# Patient Record
Sex: Female | Born: 1970 | Race: White | Hispanic: No | Marital: Married | Smoking: Former smoker
Health system: Southern US, Community
[De-identification: ages and names within clinical notes are randomized; demographics above are authoritative.]

## PROBLEM LIST (undated history)

## (undated) DIAGNOSIS — R110 Nausea: Secondary | ICD-10-CM

## (undated) DIAGNOSIS — Z8659 Personal history of other mental and behavioral disorders: Secondary | ICD-10-CM

## (undated) DIAGNOSIS — G2581 Restless legs syndrome: Secondary | ICD-10-CM

## (undated) DIAGNOSIS — I10 Essential (primary) hypertension: Secondary | ICD-10-CM

## (undated) DIAGNOSIS — N816 Rectocele: Secondary | ICD-10-CM

## (undated) DIAGNOSIS — F429 Obsessive-compulsive disorder, unspecified: Secondary | ICD-10-CM

## (undated) DIAGNOSIS — N8110 Cystocele, unspecified: Secondary | ICD-10-CM

## (undated) HISTORY — PX: ABDOMINAL HYSTERECTOMY: SHX81

## (undated) HISTORY — DX: Personal history of other mental and behavioral disorders: Z86.59

## (undated) HISTORY — PX: TUBAL LIGATION: SHX77

---

## 2000-03-25 ENCOUNTER — Ambulatory Visit (HOSPITAL_COMMUNITY): Admission: RE | Admit: 2000-03-25 | Discharge: 2000-03-25 | Payer: Self-pay | Admitting: Gastroenterology

## 2000-03-25 ENCOUNTER — Encounter: Payer: Self-pay | Admitting: Gastroenterology

## 2006-12-19 ENCOUNTER — Emergency Department: Payer: Self-pay | Admitting: Emergency Medicine

## 2008-12-10 ENCOUNTER — Emergency Department: Payer: Self-pay | Admitting: Emergency Medicine

## 2008-12-12 ENCOUNTER — Emergency Department: Payer: Self-pay | Admitting: Internal Medicine

## 2012-04-17 ENCOUNTER — Ambulatory Visit: Payer: Self-pay | Admitting: General Practice

## 2012-04-17 LAB — CREATININE, SERUM
Creatinine: 0.72 mg/dL (ref 0.60–1.30)
EGFR (African American): >60
EGFR (Non-African Amer.): >60

## 2012-04-17 LAB — HCG, QUANTITATIVE, PREGNANCY: Beta Hcg, Quant.: <1 m[IU]/mL — ABNORMAL LOW

## 2012-04-22 LAB — HM PAP SMEAR: HM Pap smear: NEGATIVE

## 2012-05-09 ENCOUNTER — Ambulatory Visit: Payer: Self-pay | Admitting: Obstetrics and Gynecology

## 2012-05-09 LAB — HEMOGLOBIN: HGB: 13.8 g/dL (ref 12.0–16.0)

## 2012-05-09 LAB — BASIC METABOLIC PANEL
Anion Gap: 9 (ref 7–16)
BUN: 10 mg/dL (ref 7–18)
Calcium, Total: 8.9 mg/dL (ref 8.5–10.1)
Chloride: 108 mmol/L — ABNORMAL HIGH (ref 98–107)
Co2: 23 mmol/L (ref 21–32)
Creatinine: 0.68 mg/dL (ref 0.60–1.30)
EGFR (African American): >60
EGFR (Non-African Amer.): >60
Glucose: 90 mg/dL (ref 65–99)
Osmolality: 278 (ref 275–301)
Potassium: 3.5 mmol/L (ref 3.5–5.1)
Sodium: 140 mmol/L (ref 136–145)

## 2012-05-09 LAB — PREGNANCY, URINE: Pregnancy Test, Urine: NEGATIVE m[IU]/mL

## 2012-05-15 ENCOUNTER — Ambulatory Visit: Payer: Self-pay | Admitting: Obstetrics and Gynecology

## 2012-05-16 LAB — BASIC METABOLIC PANEL
Anion Gap: 7 (ref 7–16)
BUN: 8 mg/dL (ref 7–18)
Calcium, Total: 8.5 mg/dL (ref 8.5–10.1)
Chloride: 108 mmol/L — ABNORMAL HIGH (ref 98–107)
Co2: 24 mmol/L (ref 21–32)
Creatinine: 0.76 mg/dL (ref 0.60–1.30)
EGFR (African American): >60
EGFR (Non-African Amer.): >60
Glucose: 154 mg/dL — ABNORMAL HIGH (ref 65–99)
Osmolality: 279 (ref 275–301)
Potassium: 4.1 mmol/L (ref 3.5–5.1)
Sodium: 139 mmol/L (ref 136–145)

## 2012-05-16 LAB — PATHOLOGY REPORT

## 2012-05-16 LAB — HEMATOCRIT: HCT: 40.1 % (ref 35.0–47.0)

## 2012-05-29 ENCOUNTER — Ambulatory Visit: Payer: Self-pay | Admitting: General Practice

## 2012-06-07 ENCOUNTER — Ambulatory Visit: Payer: Self-pay | Admitting: General Practice

## 2013-07-29 ENCOUNTER — Emergency Department: Payer: Self-pay | Admitting: Emergency Medicine

## 2013-07-29 LAB — COMPREHENSIVE METABOLIC PANEL
AST: 15 U/L (ref 15–37)
Albumin: 3.7 g/dL (ref 3.4–5.0)
Alkaline Phosphatase: 79 U/L
Anion Gap: 5 — ABNORMAL LOW (ref 7–16)
BILIRUBIN TOTAL: 0.4 mg/dL (ref 0.2–1.0)
BUN: 14 mg/dL (ref 7–18)
CO2: 24 mmol/L (ref 21–32)
Calcium, Total: 8.7 mg/dL (ref 8.5–10.1)
Chloride: 109 mmol/L — ABNORMAL HIGH (ref 98–107)
Creatinine: 0.78 mg/dL (ref 0.60–1.30)
EGFR (Non-African Amer.): >60
GLUCOSE: 92 mg/dL (ref 65–99)
OSMOLALITY: 276 (ref 275–301)
Potassium: 4 mmol/L (ref 3.5–5.1)
SGPT (ALT): 26 U/L (ref 12–78)
SODIUM: 138 mmol/L (ref 136–145)
Total Protein: 6.9 g/dL (ref 6.4–8.2)

## 2013-07-29 LAB — CBC
HCT: 42.3 % (ref 35.0–47.0)
HGB: 14.6 g/dL (ref 12.0–16.0)
MCH: 32.7 pg (ref 26.0–34.0)
MCHC: 34.6 g/dL (ref 32.0–36.0)
MCV: 95 fL (ref 80–100)
Platelet: 330 10*3/uL (ref 150–440)
RBC: 4.47 10*6/uL (ref 3.80–5.20)
RDW: 12.8 % (ref 11.5–14.5)
WBC: 12.3 10*3/uL — ABNORMAL HIGH (ref 3.6–11.0)

## 2013-07-29 LAB — URINALYSIS, COMPLETE
BILIRUBIN, UR: NEGATIVE
Blood: NEGATIVE
Glucose,UR: NEGATIVE mg/dL (ref 0–75)
Ketone: NEGATIVE
LEUKOCYTE ESTERASE: NEGATIVE
Nitrite: NEGATIVE
PH: 5 (ref 4.5–8.0)
RBC,UR: 1 /HPF (ref 0–5)
SPECIFIC GRAVITY: 1.031 (ref 1.003–1.030)
WBC UR: 1 /HPF (ref 0–5)

## 2014-01-14 ENCOUNTER — Ambulatory Visit: Payer: Self-pay | Admitting: General Practice

## 2014-04-25 ENCOUNTER — Emergency Department: Payer: Self-pay | Admitting: Emergency Medicine

## 2014-09-17 NOTE — Op Note (Signed)
PATIENT NAME:  Cheryl MenghiniRUSSELL, Cheryl Shah MR#:  [id number] DATE OF BIRTH:  1971-02-26  DATE OF PROCEDURE:  05/15/2012  PREOPERATIVE DIAGNOSIS: Menorrhagia.   POSTOPERATIVE DIAGNOSIS: Menorrhagia.   PROCEDURE: Total vaginal hysterectomy.   ANESTHESIA: General endotracheal anesthesia.   SURGEON: Suzy Bouchardhomas J Schermerhorn, M.D.   FIRST ASSISTANT: Logan BoresEvans.   INDICATIONS: This is a 44 year old gravida 5, para 4 patient with a long history of menorrhagia soaking and soiling clothes. The patient's endometrial biopsy was negative.   PROCEDURE: After adequate general endotracheal anesthesia, the patient was placed in the dorsal supine position with the legs in the candycane stirrups. Lower abdomen, vagina, and perineum prepped with Betadine. The patient was sterilely draped. The patient did receive 2 grams IV _cefoxitin____ prior to commencement of the case. A Foley was catheterized with a red Robinson catheter yielding 150 mL of clear urine. A weighted speculum was placed in the posterior vaginal vault and the anterior vagina was elevated with a Sims retractor. Two thyroid tenacula were placed on the cervix. The cervix was circumferentially injected with 0.5% lidocaine with 1:100,000 epinephrine. A direct posterior colpotomy incision was made without difficulty. Long bill weighted speculum was placed in the posterior cul-de-sac. Uterosacral ligaments were bilaterally clamped, transected, and suture ligated with 0 Vicryl suture. These were tagged for later identification. Anterior cervix was circumscribed with the Bovie and the anterior cul-de-sac was entered sharply without difficulty. The Cardinal ligaments were bilaterally clamped, transected, and suture ligated with 0 Vicryl suture. Uterine arteries were bilaterally clamped, transected, and suture ligated with 0 Vicryl suture serial clamps in the broad ligament followed by transection and suture ligated with 0 Vicryl suture. The cornua were bilaterally clamped,  transected, and doubly ligated with 0 Vicryl suture and tagged for later identification. Ovaries appeared normal bilaterally. Good hemostasis was noted. Ovarian tags were cut and the peritoneum was closed with a 2-0 PDS pursestring fashion and the vaginal epithelium was closed with a running 0 Vicryl suture. Good approximation of the edges. Good hemostasis. There were no complications. There was a small mucocele at the introitus that was opened and drained yielding old hemorrhagic fluid. Estimated blood loss 25 mL. Intraoperative fluids were 600 mL. Foley catheter was placed at the end of the case yielding clear urine. Again, no complications. The patient was taken to the recovery room in good condition.   ____________________________ Suzy Bouchardhomas J. Schermerhorn, MD tjs:aw D: 05/15/2012 12:12:56 ET T: 05/16/2012 11:20:02 ET JOB#: [id number]  cc: Suzy Bouchardhomas J. Schermerhorn, MD, <Dictator> Suzy BouchardHOMAS J SCHERMERHORN MD ELECTRONICALLY SIGNED 05/29/2012 22:24

## 2014-09-17 NOTE — Discharge Summary (Signed)
PATIENT NAME:  Cheryl Shah, Cheryl Shah MR#:  [id number] DATE OF BIRTH:  01-20-1971  DATE OF ADMISSION:  05/15/2012 DATE OF DISCHARGE:  05/16/2012  PRINCIPLE PROCEDURE: Total vaginal hysterectomy.   HOSPITAL COURSE: The patient underwent the above procedure which was uncomplicated. Postoperative day #1, hematocrit 40.1%, BUN and creatinine of 8 and 0.76. The patient's vital signs stable throughout hospitalization. The patient was complaining of some right lower quadrant tenderness which she had prior to surgery.  Surgery was uncomplicated.   DISCHARGE INSTRUCTIONS:  The patient will follow up with me in 2 weeks for wound care. She is given precautions to return before then for nausea, vomiting, fever, increasing abdominal pain or heavy vaginal bleeding.   DISCHARGE MEDICATIONS:  Norco 5/325, 1 to 2 tablets every 4 to 6 hours, Colace 100 mg daily, Naprosyn 500 mg twice a day as needed for pain.    ____________________________ Suzy Bouchardhomas J. Snow Peoples, MD tjs:cs D: 05/16/2012 09:00:15 ET T: 05/16/2012 20:18:36 ET JOB#: [id number]  cc: Suzy Bouchardhomas J. Jaylenn Baiza, MD, <Dictator> Suzy BouchardHOMAS J Lashunta Frieden MD ELECTRONICALLY SIGNED 05/29/2012 22:24

## 2015-05-21 ENCOUNTER — Emergency Department
Admission: EM | Admit: 2015-05-21 | Discharge: 2015-05-21 | Disposition: A | Discharge status: Home / Self Care | Payer: Self-pay | Attending: Emergency Medicine | Admitting: Emergency Medicine

## 2015-05-21 ENCOUNTER — Emergency Department: Payer: Self-pay

## 2015-05-21 DIAGNOSIS — L03115 Cellulitis of right lower limb: Secondary | ICD-10-CM | POA: Insufficient documentation

## 2015-05-21 DIAGNOSIS — F17210 Nicotine dependence, cigarettes, uncomplicated: Secondary | ICD-10-CM | POA: Insufficient documentation

## 2015-05-21 DIAGNOSIS — I10 Essential (primary) hypertension: Secondary | ICD-10-CM | POA: Insufficient documentation

## 2015-05-21 HISTORY — DX: Essential (primary) hypertension: I10

## 2015-05-21 MED ORDER — SULFAMETHOXAZOLE-TRIMETHOPRIM 800-160 MG PO TABS: 2.0000 | ORAL_TABLET | Freq: Two times a day (BID) | ORAL | Status: DC

## 2015-05-21 MED ORDER — SULFAMETHOXAZOLE-TRIMETHOPRIM 800-160 MG PO TABS
2.0000 | ORAL_TABLET | Freq: Once | ORAL | Status: AC
  Administered 2015-05-21: 2 via ORAL
  Filled 2015-05-21: qty 2

## 2015-05-21 MED ORDER — CEPHALEXIN 500 MG PO CAPS
500.0000 mg | ORAL_CAPSULE | Freq: Once | ORAL | Status: AC
  Administered 2015-05-21: 500 mg via ORAL
  Filled 2015-05-21: qty 1

## 2015-05-21 MED ORDER — CEPHALEXIN 500 MG PO CAPS: 500.0000 mg | ORAL_CAPSULE | Freq: Four times a day (QID) | ORAL | Status: AC

## 2015-05-21 MED ORDER — HYDROCHLOROTHIAZIDE 12.5 MG PO CAPS: 12.5000 mg | ORAL_CAPSULE | Freq: Every day | ORAL | Status: DC

## 2015-05-21 NOTE — ED Notes (Signed)
Patient transported to Ultrasound 

## 2015-05-21 NOTE — Discharge Instructions (Signed)
Cellulitis °Cellulitis is an infection of the skin and the tissue beneath it. The infected area is usually red and tender. Cellulitis occurs most often in the arms and lower legs.  °CAUSES  °Cellulitis is caused by bacteria that enter the skin through cracks or cuts in the skin. The most common types of bacteria that cause cellulitis are staphylococci and streptococci. °SIGNS AND SYMPTOMS  °· Redness and warmth. °· Swelling. °· Tenderness or pain. °· Fever. °DIAGNOSIS  °Your health care provider can usually determine what is wrong based on a physical exam. Blood tests may also be done. °TREATMENT  °Treatment usually involves taking an antibiotic medicine. °HOME CARE INSTRUCTIONS  °· Take your antibiotic medicine as directed by your health care provider. Finish the antibiotic even if you start to feel better. °· Keep the infected arm or leg elevated to reduce swelling. °· Apply a warm cloth to the affected area up to 4 times per day to relieve pain. °· Take medicines only as directed by your health care provider. °· Keep all follow-up visits as directed by your health care provider. °SEEK MEDICAL CARE IF:  °· You notice red streaks coming from the infected area. °· Your red area gets larger or turns dark in color. °· Your bone or joint underneath the infected area becomes painful after the skin has healed. °· Your infection returns in the same area or another area. °· You notice a swollen bump in the infected area. °· You develop new symptoms. °· You have a fever. °SEEK IMMEDIATE MEDICAL CARE IF:  °· You feel very sleepy. °· You develop vomiting or diarrhea. °· You have a general ill feeling (malaise) with muscle aches and pains. °  °This information is not intended to replace advice given to you by your health care provider. Make sure you discuss any questions you have with your health care provider. °  °Document Released: 02/24/2005 Document Revised: 02/05/2015 Document Reviewed: 08/02/2011 °Elsevier Interactive  Patient Education ©2016 [employer] ° °Hypertension °Hypertension, commonly called high blood pressure, is when the force of blood pumping through your arteries is too strong. Your arteries are the blood vessels that carry blood from your heart throughout your body. A blood pressure reading consists of a higher number over a lower number, such as 110/72. The higher number (systolic) is the pressure inside your arteries when your heart pumps. The lower number (diastolic) is the pressure inside your arteries when your heart relaxes. Ideally you want your blood pressure below 120/80. °Hypertension forces your heart to work harder to pump blood. Your arteries may become narrow or stiff. Having untreated or uncontrolled hypertension can cause heart attack, stroke, kidney disease, and other problems. °RISK FACTORS °Some risk factors for high blood pressure are controllable. Others are not.  °Risk factors you cannot control include:  °· Race. You may be at higher risk if you are African American. °· Age. Risk increases with age. °· Gender. Men are at higher risk than women before age 45 years. After age 65, women are at higher risk than men. °Risk factors you can control include: °· Not getting enough exercise or physical activity. °· Being overweight. °· Getting too much fat, sugar, calories, or salt in your diet. °· Drinking too much alcohol. °SIGNS AND SYMPTOMS °Hypertension does not usually cause signs or symptoms. Extremely high blood pressure (hypertensive crisis) may cause headache, anxiety, shortness of breath, and nosebleed. °DIAGNOSIS °To check if you have hypertension, your health care provider will measure your   blood pressure while you are seated, with your arm held at the level of your heart. It should be measured at least twice using the same arm. Certain conditions can cause a difference in blood pressure between your right and left arms. A blood pressure reading that is higher than normal on one occasion  does not mean that you need treatment. If it is not clear whether you have high blood pressure, you may be asked to return on a different day to have your blood pressure checked again. Or, you may be asked to monitor your blood pressure at home for 1 or more weeks. °TREATMENT °Treating high blood pressure includes making lifestyle changes and possibly taking medicine. Living a healthy lifestyle can help lower high blood pressure. You may need to change some of your habits. °Lifestyle changes may include: °· Following the DASH diet. This diet is high in fruits, vegetables, and whole grains. It is low in salt, red meat, and added sugars. °· Keep your sodium intake below 2,300 mg per day. °· Getting at least 30-45 minutes of aerobic exercise at least 4 times per week. °· Losing weight if necessary. °· Not smoking. °· Limiting alcoholic beverages. °· Learning ways to reduce stress. °Your health care provider may prescribe medicine if lifestyle changes are not enough to get your blood pressure under control, and if one of the following is true: °· You are 18-59 years of age and your systolic blood pressure is above 140. °· You are 60 years of age or older, and your systolic blood pressure is above 150. °· Your diastolic blood pressure is above 90. °· You have diabetes, and your systolic blood pressure is over 140 or your diastolic blood pressure is over 90. °· You have kidney disease and your blood pressure is above 140/90. °· You have heart disease and your blood pressure is above 140/90. °Your personal target blood pressure may vary depending on your medical conditions, your age, and other factors. °HOME CARE INSTRUCTIONS °· Have your blood pressure rechecked as directed by your health care provider.   °· Take medicines only as directed by your health care provider. Follow the directions carefully. Blood pressure medicines must be taken as prescribed. The medicine does not work as well when you skip doses. Skipping  doses also puts you at risk for problems. °· Do not smoke.   °· Monitor your blood pressure at home as directed by your health care provider.  °SEEK MEDICAL CARE IF:  °· You think you are having a reaction to medicines taken. °· You have recurrent headaches or feel dizzy. °· You have swelling in your ankles. °· You have trouble with your vision. °SEEK IMMEDIATE MEDICAL CARE IF: °· You develop a severe headache or confusion. °· You have unusual weakness, numbness, or feel faint. °· You have severe chest or abdominal pain. °· You vomit repeatedly. °· You have trouble breathing. °MAKE SURE YOU:  °· Understand these instructions. °· Will watch your condition. °· Will get help right away if you are not doing well or get worse. °  °This information is not intended to replace advice given to you by your health care provider. Make sure you discuss any questions you have with your health care provider. °  °Document Released: 05/17/2005 Document Revised: 10/01/2014 Document Reviewed: 03/09/2013 °Elsevier Interactive Patient Education ©2016 Elsevier Inc. ° °

## 2015-05-21 NOTE — ED Provider Notes (Signed)
[facility] Emergency Department Provider Note  ____________________________________________  Time seen: Approximately 715 PM  I have reviewed the triage vital signs and the nursing notes.   HISTORY  Chief Complaint Leg Swelling    HPI Cheryl Shah is a 44 y.o. female with a history of untreated hypertension is presenting today with right leg swelling and redness. The patient said that she noticed the swelling and redness yesterday just above her ankle and it is increased to the anterior portion of the shin today. It is mildly swollen and painful. The patient denies any other symptoms such as fever, nausea or vomiting. She says she was not cut on her lower extremity. She says that she does not remember nicking herself the last time she shaved her legs which was this past Saturday.  Patient says she will have insurance in about a month. Says that she has a known history of hypertension but is not taking anything at this time and does not have primary care follow-up. Denies any chest pain or headache or shortness of breath.   Past Medical History  Diagnosis Date  . Hypertension     There are no active problems to display for this patient.   Past Surgical History  Procedure Laterality Date  . Abdominal hysterectomy      No current outpatient prescriptions on file.  Allergies Review of patient's allergies indicates no known allergies.  No family history on file.  Social History Social History  Substance Use Topics  . Smoking status: Current Every Day Smoker    Types: Cigarettes  . Smokeless tobacco: None  . Alcohol Use: Yes    Review of Systems Constitutional: No fever/chills Eyes: No visual changes. ENT: No sore throat. Cardiovascular: Denies chest pain. Respiratory: Denies shortness of breath. Gastrointestinal: No abdominal pain.  No nausea, no vomiting.  No diarrhea.  No constipation. Genitourinary: Negative for  dysuria. Musculoskeletal: Negative for back pain. Skin: As above Neurological: Negative for headaches, focal weakness or numbness.  10-point ROS otherwise negative.  ____________________________________________   PHYSICAL EXAM:  VITAL SIGNS: ED Triage Vitals  Enc Vitals Group     BP 05/21/15 1802 173/118 mmHg     Pulse Rate 05/21/15 1802 95     Resp 05/21/15 1802 18     Temp 05/21/15 1802 98 F (36.7 C)     Temp Source 05/21/15 1802 Oral     SpO2 05/21/15 1802 99 %     Weight 05/21/15 1802 170 lb (77.111 kg)     Height 05/21/15 1802 5\' 11"  (1.803 m)     Head Cir --      Peak Flow --      Pain Score 05/21/15 1803 5     Pain Loc --      Pain Edu? --      Excl. in GC? --     Constitutional: Alert and oriented. Well appearing and in no acute distress. Eyes: Conjunctivae are normal. PERRL. EOMI. Head: Atraumatic. Nose: No congestion/rhinnorhea. Mouth/Throat: Mucous membranes are moist.   Neck: No stridor.   Cardiovascular: Normal rate, regular rhythm. Grossly normal heart sounds.  Good peripheral circulation. Respiratory: Normal respiratory effort.  No retractions. Lungs CTAB. Gastrointestinal: Soft and nontender. No distention.  Musculoskeletal: Erythema over the right lower extremity from just proximal to the ankle to the mid shin. There is no posterior erythema however there is mild swelling and tenderness palpation. No induration. No exudate. Present and equal dorsalis pedis pulses bilaterally. No obvious  lymphangitis. No joint effusions. Neurologic:  Normal speech and language. No gross focal neurologic deficits are appreciated. No gait instability. Skin:  Skin is warm, dry and intact.  Psychiatric: Mood and affect are normal. Speech and behavior are normal.  ____________________________________________   LABS (all labs ordered are listed, but only abnormal results are displayed)  Labs Reviewed - No data to  display ____________________________________________  EKG   ____________________________________________  RADIOLOGY   ____________________________________________   PROCEDURES    ____________________________________________   INITIAL IMPRESSION / ASSESSMENT AND PLAN / ED COURSE  Pertinent labs & imaging results that were available during my care of the patient were reviewed by me and considered in my medical decision making (see chart for details).  Patient with simple-appearing cellulitis. No systemic signs or symptoms. We'll treat with antibiotics as an outpatient. We'll give first dose here. We'll also start on Hydrocort thiazide. We'll give instructions for follow-up for Phineas Real within 1 week for blood pressure recheck. The patient says that within the month she will be able to have insurance follow-up with a primary care doctor. We discussed the importance of follow-up with primary care physician because of the elevation in her blood pressure today and the importance of having a recheck after being started on HCTZ. ____________________________________________   FINAL CLINICAL IMPRESSION(S) / ED DIAGNOSES  Cellulitis. Uncontrolled hypertension.    Myrna Blazer, MD 05/21/15 [phone]

## 2015-05-21 NOTE — ED Notes (Signed)
Pt c/o swelling, redness and pain that started at the ankle last night and has started to spread up the calf today.. Denies injury

## 2015-05-30 ENCOUNTER — Encounter: Payer: Self-pay | Admitting: Emergency Medicine

## 2015-05-30 ENCOUNTER — Emergency Department
Admission: EM | Admit: 2015-05-30 | Discharge: 2015-05-30 | Disposition: A | Discharge status: Home / Self Care | Payer: Self-pay | Attending: Emergency Medicine | Admitting: Emergency Medicine

## 2015-05-30 DIAGNOSIS — Y9289 Other specified places as the place of occurrence of the external cause: Secondary | ICD-10-CM | POA: Insufficient documentation

## 2015-05-30 DIAGNOSIS — Z792 Long term (current) use of antibiotics: Secondary | ICD-10-CM | POA: Insufficient documentation

## 2015-05-30 DIAGNOSIS — Y998 Other external cause status: Secondary | ICD-10-CM | POA: Insufficient documentation

## 2015-05-30 DIAGNOSIS — T7840XA Allergy, unspecified, initial encounter: Secondary | ICD-10-CM | POA: Insufficient documentation

## 2015-05-30 DIAGNOSIS — E860 Dehydration: Secondary | ICD-10-CM | POA: Insufficient documentation

## 2015-05-30 DIAGNOSIS — Y9389 Activity, other specified: Secondary | ICD-10-CM | POA: Insufficient documentation

## 2015-05-30 DIAGNOSIS — X58XXXA Exposure to other specified factors, initial encounter: Secondary | ICD-10-CM | POA: Insufficient documentation

## 2015-05-30 DIAGNOSIS — R21 Rash and other nonspecific skin eruption: Secondary | ICD-10-CM | POA: Insufficient documentation

## 2015-05-30 DIAGNOSIS — I10 Essential (primary) hypertension: Secondary | ICD-10-CM | POA: Insufficient documentation

## 2015-05-30 DIAGNOSIS — F17210 Nicotine dependence, cigarettes, uncomplicated: Secondary | ICD-10-CM | POA: Insufficient documentation

## 2015-05-30 DIAGNOSIS — Z79899 Other long term (current) drug therapy: Secondary | ICD-10-CM | POA: Insufficient documentation

## 2015-05-30 DIAGNOSIS — R000 Tachycardia, unspecified: Secondary | ICD-10-CM | POA: Insufficient documentation

## 2015-05-30 LAB — COMPREHENSIVE METABOLIC PANEL
ALBUMIN: 3.9 g/dL (ref 3.5–5.0)
ALK PHOS: 93 U/L (ref 38–126)
ALT: 41 U/L (ref 14–54)
ANION GAP: 8 (ref 5–15)
AST: 37 U/L (ref 15–41)
BILIRUBIN TOTAL: 0.8 mg/dL (ref 0.3–1.2)
BUN: 9 mg/dL (ref 6–20)
CALCIUM: 8.3 mg/dL — AB (ref 8.9–10.3)
CO2: 21 mmol/L — ABNORMAL LOW (ref 22–32)
Chloride: 104 mmol/L (ref 101–111)
Creatinine, Ser: 0.93 mg/dL (ref 0.44–1.00)
GFR calc non Af Amer: >60 mL/min (ref >60)
GLUCOSE: 101 mg/dL — AB (ref 65–99)
POTASSIUM: 4 mmol/L (ref 3.5–5.1)
Sodium: 133 mmol/L — ABNORMAL LOW (ref 135–145)
TOTAL PROTEIN: 6.7 g/dL (ref 6.5–8.1)

## 2015-05-30 LAB — CBC WITH DIFFERENTIAL/PLATELET
BASOS PCT: 0 %
Basophils Absolute: 0 10*3/uL (ref 0–0.1)
EOS ABS: 0.8 10*3/uL — AB (ref 0–0.7)
Eosinophils Relative: 10 %
HEMATOCRIT: 50.8 % — AB (ref 35.0–47.0)
HEMOGLOBIN: 17.2 g/dL — AB (ref 12.0–16.0)
LYMPHS ABS: 0.5 10*3/uL — AB (ref 1.0–3.6)
Lymphocytes Relative: 6 %
MCH: 31.3 pg (ref 26.0–34.0)
MCHC: 34 g/dL (ref 32.0–36.0)
MCV: 92 fL (ref 80.0–100.0)
MONO ABS: 0.3 10*3/uL (ref 0.2–0.9)
MONOS PCT: 3 %
NEUTROS ABS: 6.2 10*3/uL (ref 1.4–6.5)
Neutrophils Relative %: 81 %
Platelets: 235 10*3/uL (ref 150–440)
RBC: 5.52 MIL/uL — ABNORMAL HIGH (ref 3.80–5.20)
RDW: 12.8 % (ref 11.5–14.5)
WBC: 7.8 10*3/uL (ref 3.6–11.0)

## 2015-05-30 LAB — LIPASE, BLOOD: LIPASE: 13 U/L (ref 11–51)

## 2015-05-30 MED ORDER — DIPHENHYDRAMINE HCL 50 MG/ML IJ SOLN
50.0000 mg | Freq: Once | INTRAMUSCULAR | Status: AC
  Administered 2015-05-30: 50 mg via INTRAVENOUS
  Filled 2015-05-30: qty 1

## 2015-05-30 MED ORDER — PREDNISONE 20 MG PO TABS: 40.0000 mg | ORAL_TABLET | Freq: Every day | ORAL | Status: DC

## 2015-05-30 MED ORDER — METOCLOPRAMIDE HCL 10 MG PO TABS: 10.0000 mg | ORAL_TABLET | Freq: Three times a day (TID) | ORAL | Status: DC

## 2015-05-30 MED ORDER — DIPHENHYDRAMINE HCL 25 MG PO CAPS: 50.0000 mg | ORAL_CAPSULE | Freq: Four times a day (QID) | ORAL | Status: DC | PRN

## 2015-05-30 MED ORDER — METHYLPREDNISOLONE SODIUM SUCC 125 MG IJ SOLR
125.0000 mg | Freq: Once | INTRAMUSCULAR | Status: AC
  Administered 2015-05-30: 125 mg via INTRAVENOUS
  Filled 2015-05-30: qty 2

## 2015-05-30 MED ORDER — METOCLOPRAMIDE HCL 5 MG/ML IJ SOLN
10.0000 mg | Freq: Once | INTRAMUSCULAR | Status: AC
  Administered 2015-05-30: 10 mg via INTRAVENOUS

## 2015-05-30 MED ORDER — ONDANSETRON HCL 4 MG/2ML IJ SOLN
4.0000 mg | Freq: Once | INTRAMUSCULAR | Status: AC
  Administered 2015-05-30: 4 mg via INTRAVENOUS
  Filled 2015-05-30: qty 2

## 2015-05-30 MED ORDER — METOCLOPRAMIDE HCL 5 MG/ML IJ SOLN
INTRAMUSCULAR | Status: AC
  Filled 2015-05-30: qty 2

## 2015-05-30 MED ORDER — FAMOTIDINE IN NACL 20-0.9 MG/50ML-% IV SOLN
20.0000 mg | Freq: Once | INTRAVENOUS | Status: AC
  Administered 2015-05-30: 20 mg via INTRAVENOUS
  Filled 2015-05-30: qty 50

## 2015-05-30 MED ORDER — SODIUM CHLORIDE 0.9 % IV BOLUS (SEPSIS)
1000.0000 mL | Freq: Once | INTRAVENOUS | Status: AC
  Administered 2015-05-30: 1000 mL via INTRAVENOUS

## 2015-05-30 NOTE — ED Notes (Signed)
Pt was seen here two weeks ago for leg infection and started on keflex and bactrim. Presents today with allergic reaction to meds. Pt  Noted to have red rash to face, neck arms and torso. States it burns.

## 2015-05-30 NOTE — ED Notes (Signed)
MD at bedside. 

## 2015-05-30 NOTE — ED Notes (Signed)
Pt started on antibiotics for leg infection X 2 weeks ago. NV since beginning antibiotics. Pt awoke this AM with red rash to whole body that is burning per patient report. No airway problems. MD at bedside. Pt right lower leg is site of infection, color WNL.

## 2015-05-30 NOTE — ED Provider Notes (Signed)
[facility] Emergency Department Provider Note  ____________________________________________  Time seen: 8:30 AM  I have reviewed the triage vital signs and the nursing notes.   HISTORY  Chief Complaint Allergic Reaction    HPI Cheryl Shah is a 44 y.o. female who woke up this morning with a pruritic rash all over her body. She was seen in the emergency department 9 days ago for right lower extremity erythema and nausea vomiting and body aches. She was diagnosed with cellulitis at that time and started on Keflex Bactrim as well as hydrochlorothiazide for stage II hypertension. Since then, she is continued to have stomach upset with frequent vomiting, but has continued taking the medications to treat her cellulitis. She has had a difficult time eating or drinking fluids. Denies any fevers chills dizziness or syncope and overall she felt like the leg was getting better, and then when she woke up this when she noticed that she was covered with an itchy rash all over her body. Denies any shortness of breath abdominal pain or cramping or frequent vomiting this morning. No throat swelling or tongue swelling or upper airway issues. Denies any known medication or food allergies. No other new exposures.     Past Medical History  Diagnosis Date  . Hypertension      There are no active problems to display for this patient.    Past Surgical History  Procedure Laterality Date  . Abdominal hysterectomy       Current Outpatient Rx  Name  Route  Sig  Dispense  Refill  . cephALEXin (KEFLEX) 500 MG capsule   Oral   Take 1 capsule (500 mg total) by mouth 4 (four) times daily.   40 capsule   0   . hydrochlorothiazide (MICROZIDE) 12.5 MG capsule   Oral   Take 1 capsule (12.5 mg total) by mouth daily.   30 capsule   0   . sulfamethoxazole-trimethoprim (BACTRIM DS,SEPTRA DS) 800-160 MG tablet   Oral   Take 2 tablets by mouth 2 (two) times daily.   40 tablet  0   . diphenhydrAMINE (BENADRYL) 25 mg capsule   Oral   Take 2 capsules (50 mg total) by mouth every 6 (six) hours as needed.   60 capsule   0   . metoCLOPramide (REGLAN) 10 MG tablet   Oral   Take 1 tablet (10 mg total) by mouth 4 (four) times daily -  before meals and at bedtime.   60 tablet   0   . predniSONE (DELTASONE) 20 MG tablet   Oral   Take 2 tablets (40 mg total) by mouth daily.   8 tablet   0      Allergies Bactrim   No family history on file.  Social History Social History  Substance Use Topics  . Smoking status: Current Every Day Smoker    Types: Cigarettes  . Smokeless tobacco: Never Used  . Alcohol Use: Yes    Review of Systems  Constitutional:   No fever or chills. No weight changes Eyes:   No blurry vision or double vision.  ENT:   No sore throat. Cardiovascular:   No chest pain. Respiratory:   No dyspnea or cough. Gastrointestinal:   Negative for abdominal pain, positive nausea and vomiting for about 2 weeks.  No BRBPR or melena. Genitourinary:   Negative for dysuria, urinary retention, bloody urine, or difficulty urinating. Musculoskeletal:   Negative for back pain. No joint swelling or pain. Skin:  Positive as above for rash. Neurological:   Negative for headaches, focal weakness or numbness. Psychiatric:  No anxiety or depression.   Endocrine:  No hot/cold intolerance, changes in energy, or sleep difficulty.  10-point ROS otherwise negative.  ____________________________________________   PHYSICAL EXAM:  VITAL SIGNS: ED Triage Vitals  Enc Vitals Group     BP 05/30/15 0820 143/80 mmHg     Pulse Rate 05/30/15 0820 127     Resp 05/30/15 0820 18     Temp 05/30/15 0820 98 F (36.7 C)     Temp Source 05/30/15 0820 Oral     SpO2 05/30/15 0820 100 %     Weight 05/30/15 0820 163 lb (73.936 kg)     Height 05/30/15 0820  (1.702 m)     Head Cir --      Peak Flow --      Pain Score 05/30/15 0821 10     Pain Loc --      Pain  Edu? --      Excl. in GC? --     Vital signs reviewed, nursing assessments reviewed.   Constitutional:   Alert and oriented. Well appearing and in no distress. Eyes:   No scleral icterus. No conjunctival pallor. PERRL. EOMI ENT   Head:   Normocephalic and atraumatic. No facial edema   Nose:   No congestion/rhinnorhea. No septal hematoma   Mouth/Throat:   MMM, no pharyngeal erythema. No peritonsillar mass. No uvula shift. No mucosal lesions. No edema   Neck:   No stridor. No SubQ emphysema. No meningismus. Hematological/Lymphatic/Immunilogical:   No cervical lymphadenopathy. Cardiovascular:   Tachycardia heart rate 125. Normal and symmetric distal pulses are present in all extremities. No murmurs, rubs, or gallops. Respiratory:   Normal respiratory effort without tachypnea nor retractions. Breath sounds are clear and equal bilaterally. No wheezes/rales/rhonchi. No stridor Gastrointestinal:   Soft and nontender. No distention. There is no CVA tenderness.  No rebound, rigidity, or guarding. Genitourinary:   deferred Musculoskeletal:   Nontender with normal range of motion in all extremities. No joint effusions.  No lower extremity tenderness.  No edema. Previously described right lower extremity cellulitis has resolved. No abscess induration crepitus or tenderness. No swelling Neurologic:   Normal speech and language.  CN 2-10 normal. Motor grossly intact. No pronator drift.  Normal gait. No gross focal neurologic deficits are appreciated.  Skin:    Skin is warm, dry and intact. Diffuse morbilliform rash on the trunk and extremities and face.  No petechiae, purpura, or bullae. Psychiatric:   Mood and affect are normal. Speech and behavior are normal. Patient exhibits appropriate insight and judgment.  ____________________________________________    LABS (pertinent positives/negatives) (all labs ordered are listed, but only abnormal results are displayed) Labs Reviewed  CBC  WITH DIFFERENTIAL/PLATELET - Abnormal; Notable for the following:    RBC 5.52 (*)    Hemoglobin 17.2 (*)    HCT 50.8 (*)    Lymphs Abs 0.5 (*)    Eosinophils Absolute 0.8 (*)    All other components within normal limits  COMPREHENSIVE METABOLIC PANEL - Abnormal; Notable for the following:    Sodium 133 (*)    CO2 21 (*)    Glucose, Bld 101 (*)    Calcium 8.3 (*)    All other components within normal limits  LIPASE, BLOOD   ____________________________________________   EKG    ____________________________________________    RADIOLOGY    ____________________________________________   PROCEDURES   ____________________________________________  INITIAL IMPRESSION / ASSESSMENT AND PLAN / ED COURSE  Pertinent labs & imaging results that were available during my care of the patient were reviewed by me and considered in my medical decision making (see chart for details).  Patient is apparently having an allergic reaction that consists of hives. Unfortunately because she started 3 new medicines at once, were unable to identify which one may be the culprit although it's most likely Bactrim. She also very likely has a degree of dehydration because of her GI upset. We'll give her antihistamine and steroids and antiemetics as well as IV fluids for rehydration and monitor for improvement in the emergency department. Does not have anaphylaxis at this time, no evidence of Stevens-Johnson syndrome or TEN. This does not appear to be a vasculitis. ----------------------------------------- 1:07 PM on 05/30/2015 -----------------------------------------  Laboratory results finally obtained. Tachycardia has resolved after 2 L IV fluids with a heart rate of 90. Patient is ambulatory, comfortable and feels much better. Nausea is resolved as well. CBC shows a slight eosinophilia, but creatinine and LFTs are all normal, so this is not consistent with DRESS.  We'll continue the patient on  antihistamines and prednisone, follow up with primary care. Counseled to discontinue Bactrim and hydrochlorothiazide and to consider these as allergies. Anticipatory guidance given that she can expect the rash to gradually improve until she experiences desquamation and resolution.   ____________________________________________   FINAL CLINICAL IMPRESSION(S) / ED DIAGNOSES  Final diagnoses:  Dehydration  Allergic reaction to drug      Sharman CheekPhillip Areej Tayler, MD 05/30/15 1308

## 2015-05-30 NOTE — Discharge Instructions (Signed)

## 2017-09-29 IMAGING — US US EXTREM LOW VENOUS*R*
1 series · 13 of 24 positions shown · non-contrast
Comparison: None.

CLINICAL DATA: 44-year-old female with right lower extremity pain
and swelling x1 day



[Series 1: us extrem low venous*right* · 0.07mm/px · 13 of 34 slices shown]
[im 1/34]
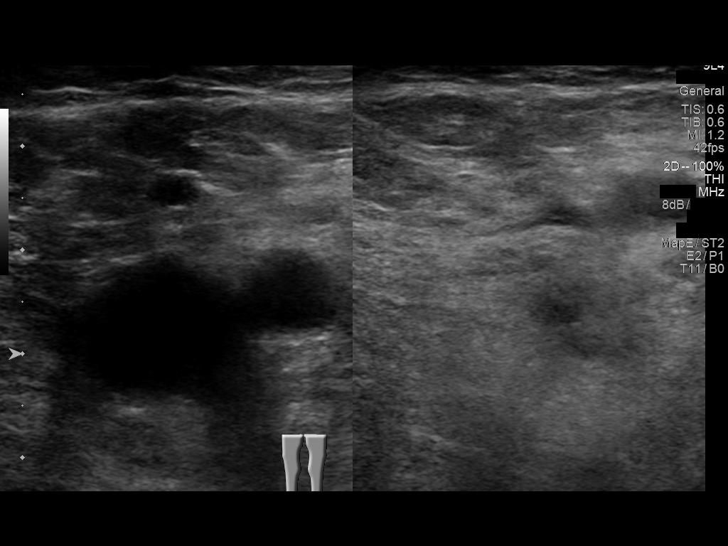
[im 3/34]
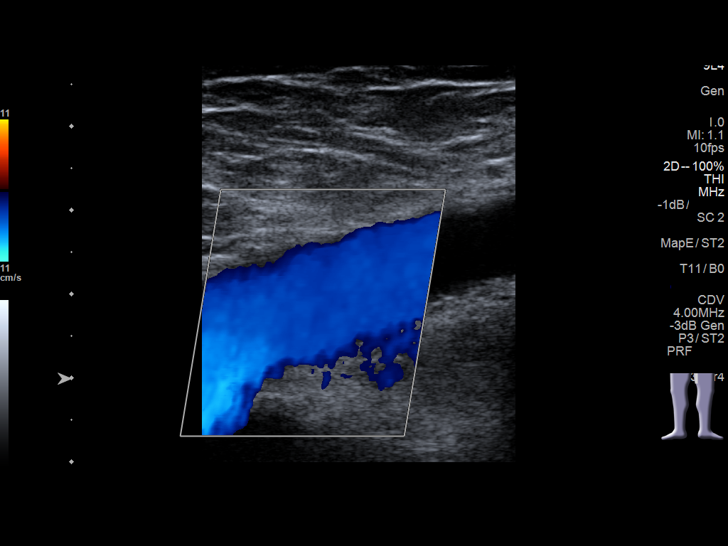
[im 6/34]
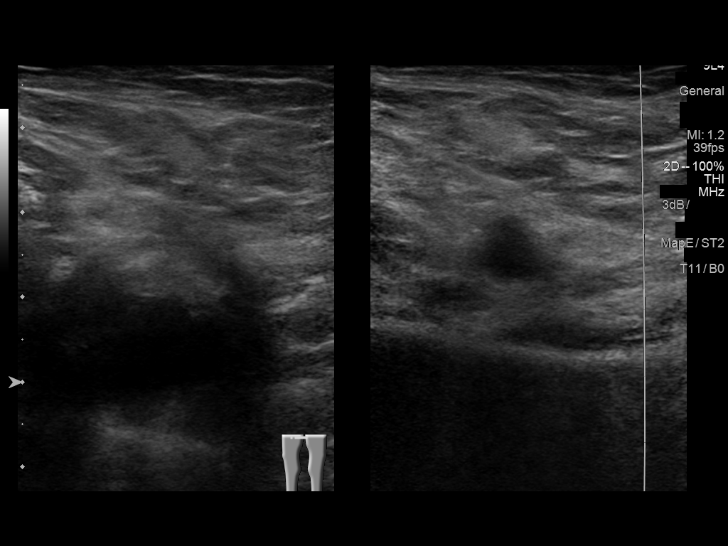
[im 9/34]
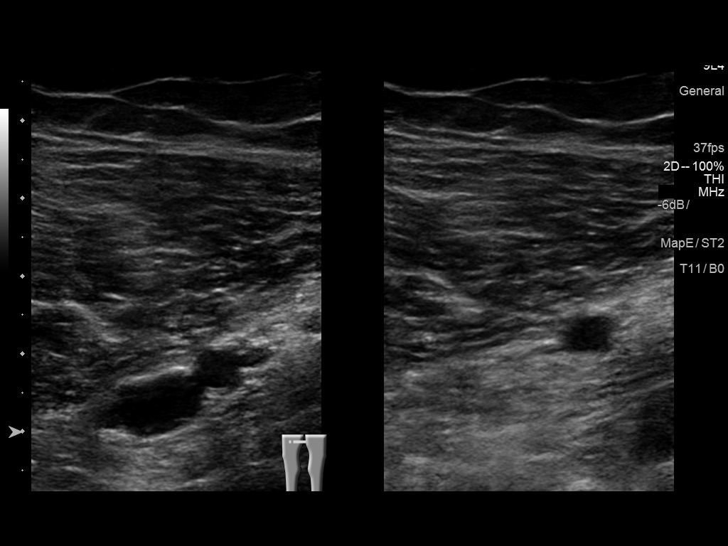
[im 12/34]
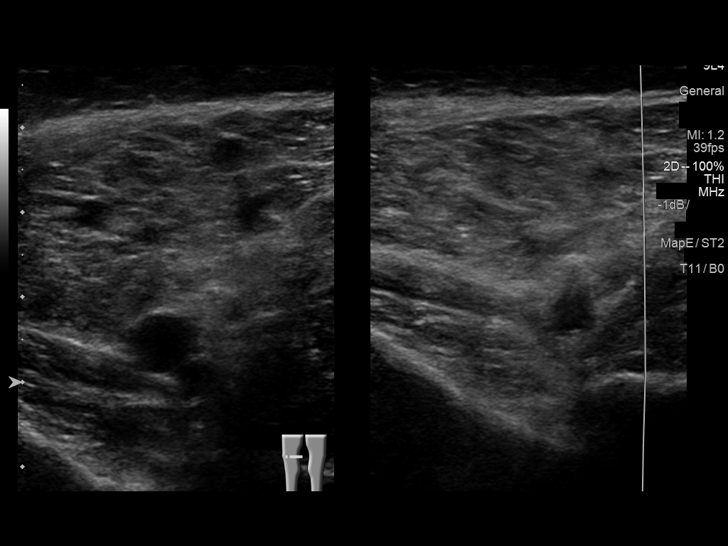
[im 15/34]
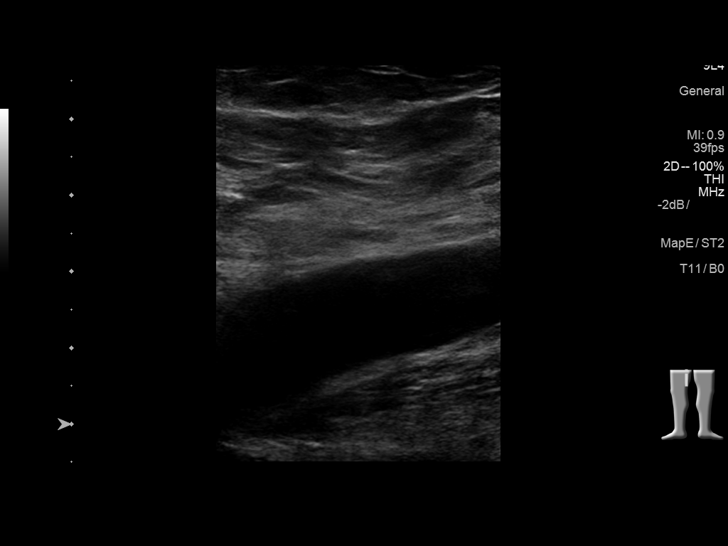
[im 18/34]
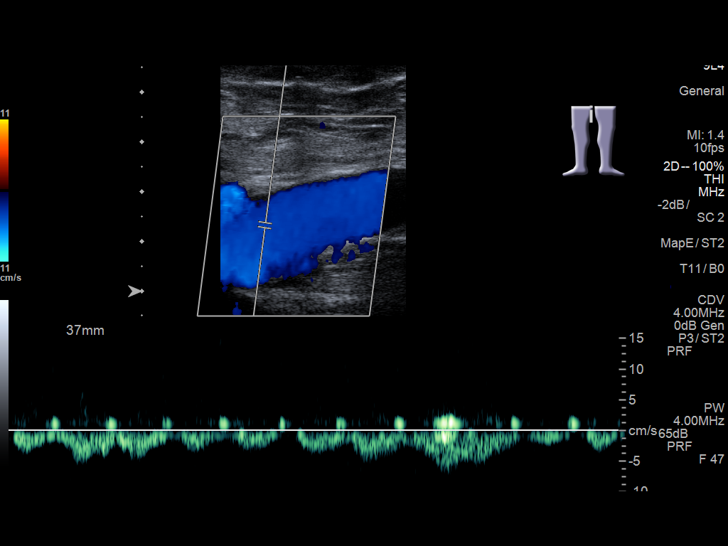
[im 19/34]
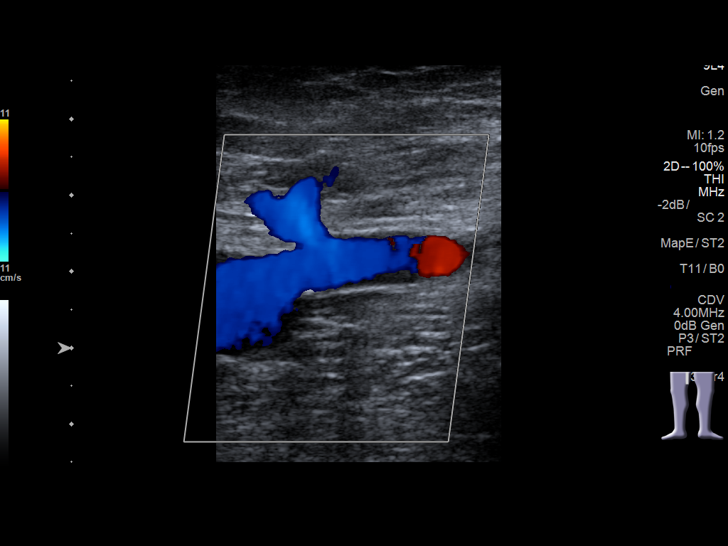
[im 22/34]
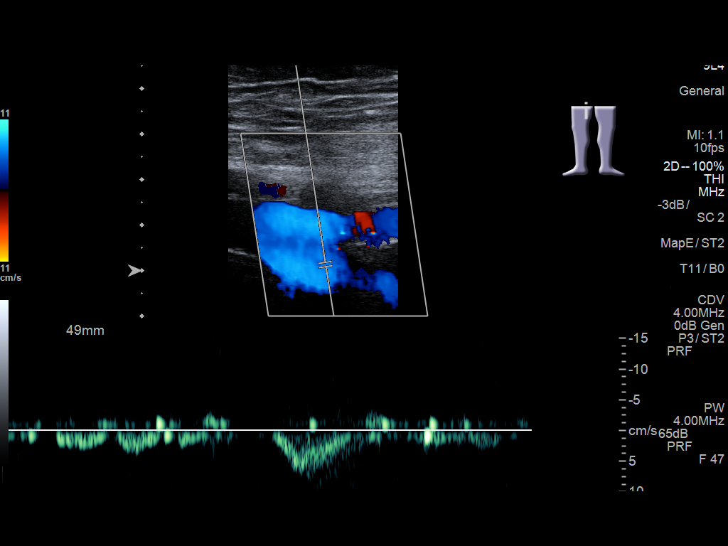
[im 25/34]
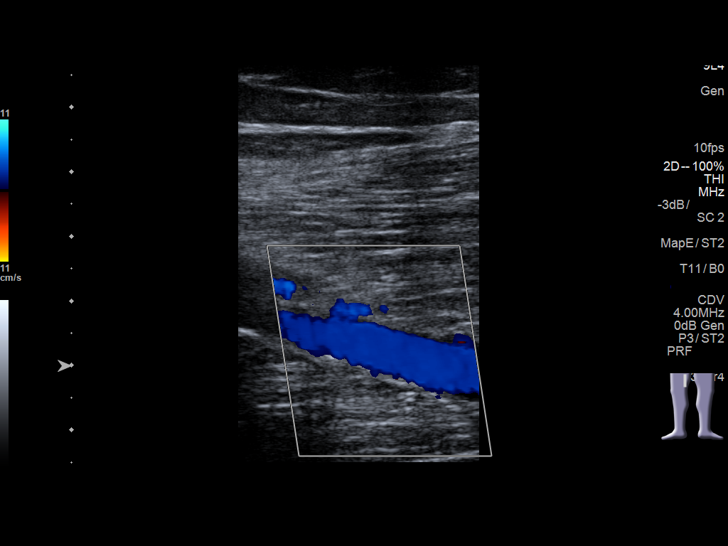
[im 28/34]
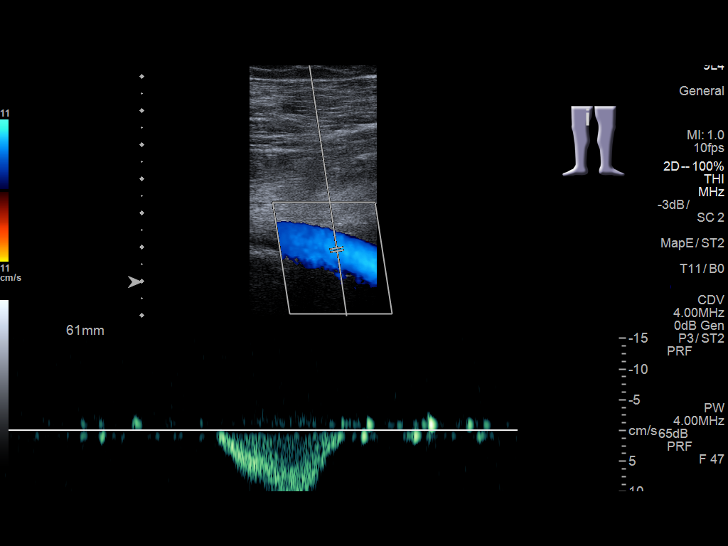
[im 31/34]
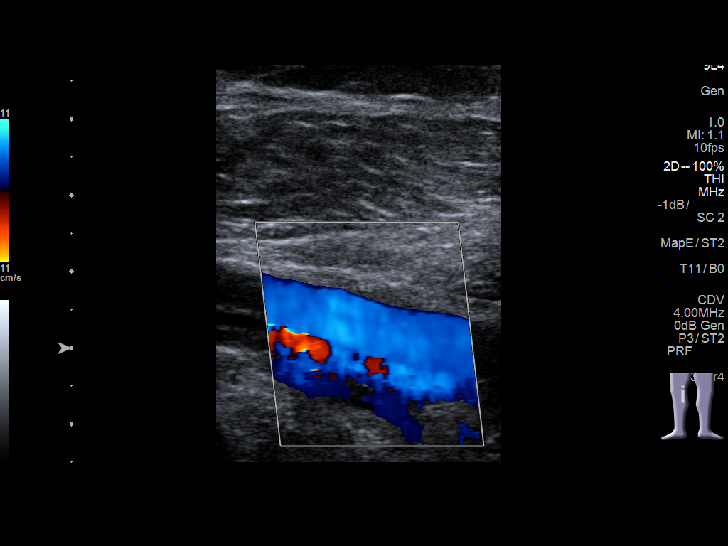
[im 34/34]
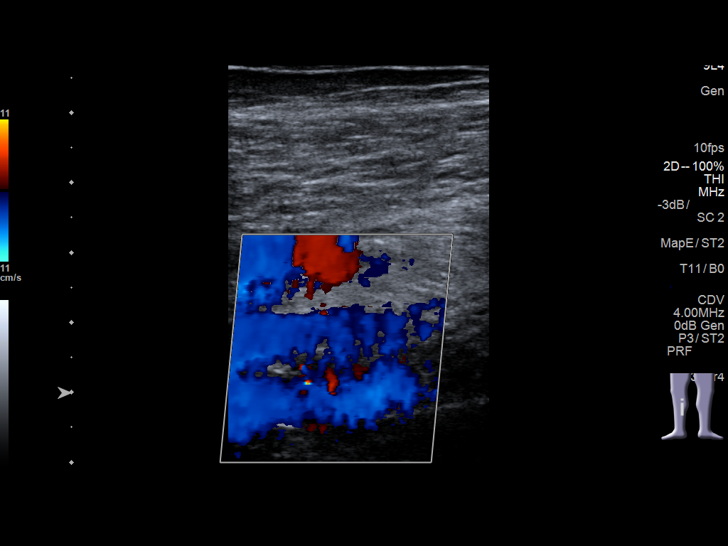

[13 of 24 positions shown; findings below may reference images not displayed]

FINDINGS: Contralateral Common Femoral Vein: Respiratory phasicity is normal
and symmetric with the symptomatic side. No evidence of thrombus.
Normal compressibility.

Common Femoral Vein: No evidence of thrombus. Normal
compressibility, respiratory phasicity and response to augmentation.

Saphenofemoral Junction: No evidence of thrombus. Normal
compressibility and flow on color Doppler imaging.

Profunda Femoral Vein: No evidence of thrombus. Normal
compressibility and flow on color Doppler imaging.

Femoral Vein: No evidence of thrombus. Normal compressibility,
respiratory phasicity and response to augmentation.

Popliteal Vein: No evidence of thrombus. Normal compressibility,
respiratory phasicity and response to augmentation.

Calf Veins: No evidence of thrombus. Normal compressibility and flow
on color Doppler imaging.

Superficial Great Saphenous Vein: No evidence of thrombus. Normal
compressibility and flow on color Doppler imaging.
IMPRESSION: No evidence of deep venous thrombosis.

## 2018-10-16 ENCOUNTER — Other Ambulatory Visit
Admission: RE | Admit: 2018-10-16 | Discharge: 2018-10-16 | Disposition: A | Discharge status: Home / Self Care | Payer: HRSA Program | Source: Ambulatory Visit | Attending: Family Medicine | Admitting: Family Medicine

## 2018-10-16 DIAGNOSIS — Z1159 Encounter for screening for other viral diseases: Secondary | ICD-10-CM | POA: Insufficient documentation

## 2018-10-16 LAB — SARS CORONAVIRUS 2 BY RT PCR (HOSPITAL ORDER, PERFORMED IN ~~LOC~~ HOSPITAL LAB): SARS Coronavirus 2: NEGATIVE

## 2019-02-02 ENCOUNTER — Other Ambulatory Visit: Payer: Self-pay | Admitting: Internal Medicine

## 2019-02-02 DIAGNOSIS — Z20822 Contact with and (suspected) exposure to covid-19: Secondary | ICD-10-CM

## 2019-02-03 LAB — NOVEL CORONAVIRUS, NAA: SARS-CoV-2, NAA: NOT DETECTED

## 2019-04-10 ENCOUNTER — Other Ambulatory Visit: Payer: Self-pay

## 2019-04-10 ENCOUNTER — Ambulatory Visit (INDEPENDENT_AMBULATORY_CARE_PROVIDER_SITE_OTHER): Payer: 59 | Admitting: Physician Assistant

## 2019-04-10 ENCOUNTER — Other Ambulatory Visit: Payer: Self-pay | Admitting: Physician Assistant

## 2019-04-10 ENCOUNTER — Encounter: Payer: Self-pay | Admitting: Physician Assistant

## 2019-04-10 VITALS — BP 136/95 | HR 87 | Temp 97.1°F | Resp 15 | Ht 67.0 in | Wt 164.2 lb

## 2019-04-10 DIAGNOSIS — G2581 Restless legs syndrome: Secondary | ICD-10-CM | POA: Diagnosis not present

## 2019-04-10 DIAGNOSIS — Z130 Encounter for screening for diseases of the blood and blood-forming organs and certain disorders involving the immune mechanism: Secondary | ICD-10-CM

## 2019-04-10 DIAGNOSIS — Z1231 Encounter for screening mammogram for malignant neoplasm of breast: Secondary | ICD-10-CM | POA: Diagnosis not present

## 2019-04-10 DIAGNOSIS — M79605 Pain in left leg: Secondary | ICD-10-CM

## 2019-04-10 DIAGNOSIS — M79604 Pain in right leg: Secondary | ICD-10-CM

## 2019-04-10 DIAGNOSIS — Z114 Encounter for screening for human immunodeficiency virus [HIV]: Secondary | ICD-10-CM

## 2019-04-10 DIAGNOSIS — R232 Flushing: Secondary | ICD-10-CM | POA: Diagnosis not present

## 2019-04-10 DIAGNOSIS — I10 Essential (primary) hypertension: Secondary | ICD-10-CM

## 2019-04-10 MED ORDER — PAROXETINE HCL 10 MG PO TABS: 10.0000 mg | ORAL_TABLET | Freq: Every day | ORAL | 0 refills | Status: DC

## 2019-04-10 MED ORDER — ROPINIROLE HCL 0.5 MG PO TABS: ORAL_TABLET | ORAL | 1 refills | Status: DC

## 2019-04-10 MED ORDER — AMLODIPINE BESYLATE 5 MG PO TABS: 5.0000 mg | ORAL_TABLET | Freq: Every day | ORAL | 0 refills | Status: DC

## 2019-04-10 NOTE — Progress Notes (Signed)
Patient: Cheryl Shah, Female    DOB: 01/13/1971, [age] y.o.   MRN: [id number] Visit Date: 04/10/2019  Today's Provider: Trey SailorsAdriana M Pollak, PA-C   Chief Complaint  Patient presents with  . Annual Exam   Subjective:    New Patient Appointment Cheryl Shah is a 48 y.o. female who presents today for new patient appointment. She feels fairly well today, patient reports that she has not had a previous PCP. Patient would like to address the topics today of hot flashes/ hormonal changes, elevated blood pressure and pain in her legs at nightl. She reports she is not actively exercising . She reports she is sleeping poorly, due to legs twitching at night.  Living in [place], [place] and is currently building a house in [place] that will be completed by 1st of January. Currently lives with MIL, husband, and 50 year old. Mother adopted [id number] year old. 29, 23, and 17. Four children total. No grandchildren.   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   Did have a mammogram many years ago < 10 years. Smoking - quit august 8. Vaping at 2.4% nicotine. Smoked for ~25 years 1/2 pack per day. Drugs never. Alcohol usage 2 glasses of wine Sunday - Thursday. 2 vodkas with lemonade and raspberry on Friday and Saturday.  She has a history of hysterectomy with Dr. Feliberto GottronSchermerhorn at [facility] but is unsure if she still has a cervix. Believes something on the left side was left in. Never had colon cancer screening.    Wt Readings from Last 3 Encounters:  04/10/19 164 lb 3.2 oz (74.5 kg)  05/30/15 163 lb (73.9 kg)  05/21/15 170 lb (77.1 kg)   HTN: Was given medication for this at one point HCTZ 12.5 mg daily after visit to the ER but did not end up taking this. Denies chest pain, SOB.  BP Readings from Last 3 Encounters:  04/10/19 (!) 136/95  05/30/15 129/90  05/21/15 (!) 158/99   Hot Flashes: Reports 10-15 hot flashes per day where she will soak through her clothes.  She has had a hysterectomy.    Restless legs: reports burning pain and urge to move legs at night that has been present for ~ 8 years. She will constantly move around trying to find the right spot for her to fall asleep.    -----------------------------------------------------------------   Review of Systems  Constitutional: Negative.   HENT: Negative.   Eyes: Negative.   Respiratory: Negative.   Cardiovascular: Negative.   Gastrointestinal: Negative.   Endocrine: Negative.   Genitourinary: Negative.   Musculoskeletal: Negative.   Skin: Negative.   Allergic/Immunologic: Negative.   Neurological: Negative.   Hematological: Negative.   Psychiatric/Behavioral: Negative.     Social History She  reports that she quit smoking about 3 months ago. Her smoking use included cigarettes. She has never used smokeless tobacco. She reports current alcohol use of about 2.0 standard drinks of alcohol per week. She reports that she does not use drugs. Social History   Socioeconomic History  . Marital status: Married    Spouse name: Not on file  . Number of children: Not on file  . Years of education: Not on file  . Highest education level: Not on file  Occupational History  . Not on file  Social Needs  . Financial resource strain: Not on file  . Food insecurity    Worry: Not on file  Inability: Not on file  . Transportation needs    Medical: Not on file    Non-medical: Not on file  Tobacco Use  . Smoking status: Former Smoker    Types: Cigarettes    Quit date: 01/06/2019    Years since quitting: 0.2  . Smokeless tobacco: Never Used  Substance and Sexual Activity  . Alcohol use: Yes    Alcohol/week: 2.0 standard drinks    Types: 2 Glasses of wine per week  . Drug use: Never  . Sexual activity: Not on file  Lifestyle  . Physical activity    Days per week: Not on file    Minutes per session: Not on file  . Stress: Not on file  Relationships  . Social Herbalist  on phone: Not on file    Gets together: Not on file    Attends religious service: Not on file    Active member of club or organization: Not on file    Attends meetings of clubs or organizations: Not on file    Relationship status: Not on file  Other Topics Concern  . Not on file  Social History Narrative  . Not on file    There are no active problems to display for this patient.   Past Surgical History:  Procedure Laterality Date  . ABDOMINAL HYSTERECTOMY    . TUBAL LIGATION      Family History  Family Status  Relation Name Status  . Mother  (Not Specified)  . Father  (Not Specified)   Her family history includes Heart Problems in her father and mother.     Allergies  Allergen Reactions  . Sulfa Antibiotics Anaphylaxis  . Bactrim [Sulfamethoxazole-Trimethoprim] Hives and Rash    Previous Medications   No medications on file    Patient Care Team: Paulene Floor as PCP - General (Physician Assistant)      Objective:   Vitals: BP (!) 136/95   Pulse 87   Temp (!) 97.1 F (36.2 C) (Oral)   Resp 15   Ht 5\' 7"  (1.702 m)   Wt 164 lb 3.2 oz (74.5 kg)   BMI 25.72 kg/m    Physical Exam Constitutional:      Appearance: Normal appearance.  Cardiovascular:     Rate and Rhythm: Normal rate and regular rhythm.     Heart sounds: Normal heart sounds.  Pulmonary:     Effort: Pulmonary effort is normal.     Breath sounds: Normal breath sounds.  Skin:    General: Skin is warm and dry.  Neurological:     Mental Status: She is alert and oriented to person, place, and time. Mental status is at baseline.  Psychiatric:        Mood and Affect: Mood normal.        Behavior: Behavior normal.      Depression Screen PHQ 2/9 Scores 04/10/2019  PHQ - 2 Score 0  PHQ- 9 Score 2      Assessment & Plan:     Routine Health Maintenance and Physical Exam  Exercise Activities and Dietary recommendations Goals   None      There is no immunization history  on file for this patient.  Health Maintenance  Topic Date Due  . HIV Screening  01/06/1984  . TETANUS/TDAP  01/06/1988  . PAP SMEAR-Modifier  01/05/1990  . INFLUENZA VACCINE  12/30/2018  . MAMMOGRAM  01/06/2019  . COLONOSCOPY  01/06/2019  Discussed health benefits of physical activity, and encouraged her to engage in regular exercise appropriate for her age and condition.    1. Essential hypertension  Start amlodipine as below. F/u 4-6 weeks.   - Comprehensive Metabolic Panel (CMET) - Lipid Profile - TSH - amLODipine (NORVASC) 5 MG tablet; Take 1 tablet (5 mg total) by mouth daily.  Dispense: 90 tablet; Refill: 0  2. Encounter for screening mammogram for malignant neoplasm of breast  - MM Digital Screening; Future  3. Restless leg syndrome  - rOPINIRole (REQUIP) 0.5 MG tablet; Take 0.5 mg one hour before bed x 3 days. Then take 1 mg before bed onward.  Dispense: 90 tablet; Refill: 1  4. Hot flashes  I would like to see her BP well controlled before considering HRT. She may start with Paxil in the interim.   - PARoxetine (PAXIL) 10 MG tablet; Take 1 tablet (10 mg total) by mouth daily.  Dispense: 90 tablet; Refill: 0  5. Encounter for screening for HIV  - HIV antibody (with reflex)  6. Screening for deficiency anemia  - CBC with Differential  7. Pain in both lower extremities  - Magnesium - Fe+TIBC+Fer  The entirety of the information documented in the History of Present Illness, Review of Systems and Physical Exam were personally obtained by me. Portions of this information were initially documented by [facility], CMA and reviewed by me for thoroughness and accuracy.     --------------------------------------------------------------------

## 2019-04-11 LAB — CBC WITH DIFFERENTIAL/PLATELET
Basophils Absolute: 0 x10E3/uL (ref 0.0–0.2)
Basos: 0 %
EOS (ABSOLUTE): 0.2 x10E3/uL (ref 0.0–0.4)
Eos: 2 %
Hematocrit: 41.6 % (ref 34.0–46.6)
Hemoglobin: 14.4 g/dL (ref 11.1–15.9)
Immature Grans (Abs): 0 x10E3/uL (ref 0.0–0.1)
Immature Granulocytes: 0 %
Lymphocytes Absolute: 2.5 x10E3/uL (ref 0.7–3.1)
Lymphs: 29 %
MCH: 31.6 pg (ref 26.6–33.0)
MCHC: 34.6 g/dL (ref 31.5–35.7)
MCV: 91 fL (ref 79–97)
Monocytes Absolute: 0.6 x10E3/uL (ref 0.1–0.9)
Monocytes: 6 %
Neutrophils Absolute: 5.3 x10E3/uL (ref 1.4–7.0)
Neutrophils: 63 %
Platelets: 432 x10E3/uL (ref 150–450)
RBC: 4.56 x10E6/uL (ref 3.77–5.28)
RDW: 11.7 % (ref 11.7–15.4)
WBC: 8.6 x10E3/uL (ref 3.4–10.8)

## 2019-04-11 LAB — HIV ANTIBODY (ROUTINE TESTING W REFLEX): HIV Screen 4th Generation wRfx: NONREACTIVE

## 2019-04-11 LAB — IRON,TIBC AND FERRITIN PANEL
Ferritin: 424 ng/mL — ABNORMAL HIGH (ref 15–150)
Iron Saturation: 62 % — ABNORMAL HIGH (ref 15–55)
Iron: 160 ug/dL — ABNORMAL HIGH (ref 27–159)
Total Iron Binding Capacity: 260 ug/dL (ref 250–450)
UIBC: 100 ug/dL — ABNORMAL LOW (ref 131–425)

## 2019-04-11 LAB — LIPID PANEL
Chol/HDL Ratio: 3.5 ratio (ref 0.0–4.4)
Cholesterol, Total: 267 mg/dL — ABNORMAL HIGH (ref 100–199)
HDL: 77 mg/dL
LDL Chol Calc (NIH): 162 mg/dL — ABNORMAL HIGH (ref 0–99)
Triglycerides: 160 mg/dL — ABNORMAL HIGH (ref 0–149)
VLDL Cholesterol Cal: 28 mg/dL (ref 5–40)

## 2019-04-11 LAB — COMPREHENSIVE METABOLIC PANEL
ALT: 40 IU/L — ABNORMAL HIGH (ref 0–32)
AST: 21 IU/L (ref 0–40)
Albumin/Globulin Ratio: 2.1 (ref 1.2–2.2)
Albumin: 4.7 g/dL (ref 3.8–4.8)
Alkaline Phosphatase: 100 IU/L (ref 39–117)
BUN/Creatinine Ratio: 20 (ref 9–23)
BUN: 15 mg/dL (ref 6–24)
Bilirubin Total: 0.3 mg/dL (ref 0.0–1.2)
CO2: 23 mmol/L (ref 20–29)
Calcium: 10.4 mg/dL — ABNORMAL HIGH (ref 8.7–10.2)
Chloride: 100 mmol/L (ref 96–106)
Creatinine, Ser: 0.75 mg/dL (ref 0.57–1.00)
GFR calc Af Amer: 107 mL/min/{1.73_m2} (ref >59)
GFR calc non Af Amer: 93 mL/min/{1.73_m2} (ref >59)
Globulin, Total: 2.2 g/dL (ref 1.5–4.5)
Glucose: 87 mg/dL (ref 65–99)
Potassium: 4 mmol/L (ref 3.5–5.2)
Sodium: 138 mmol/L (ref 134–144)
Total Protein: 6.9 g/dL (ref 6.0–8.5)

## 2019-04-11 LAB — MAGNESIUM: Magnesium: 2.3 mg/dL (ref 1.6–2.3)

## 2019-04-11 LAB — TSH: TSH: 0.702 u[IU]/mL (ref 0.450–4.500)

## 2019-04-12 NOTE — Patient Instructions (Signed)

## 2019-05-28 ENCOUNTER — Other Ambulatory Visit: Payer: Self-pay | Admitting: Physician Assistant

## 2019-05-28 DIAGNOSIS — R232 Flushing: Secondary | ICD-10-CM

## 2019-06-11 ENCOUNTER — Other Ambulatory Visit: Payer: Self-pay

## 2019-06-11 ENCOUNTER — Encounter: Payer: Self-pay | Admitting: Physician Assistant

## 2019-06-11 ENCOUNTER — Ambulatory Visit: Payer: 59 | Admitting: Physician Assistant

## 2019-06-11 VITALS — BP 145/95 | HR 87 | Temp 98.2°F | Wt 171.0 lb

## 2019-06-11 DIAGNOSIS — I10 Essential (primary) hypertension: Secondary | ICD-10-CM

## 2019-06-11 DIAGNOSIS — G2581 Restless legs syndrome: Secondary | ICD-10-CM | POA: Diagnosis not present

## 2019-06-11 DIAGNOSIS — Z1211 Encounter for screening for malignant neoplasm of colon: Secondary | ICD-10-CM | POA: Diagnosis not present

## 2019-06-11 DIAGNOSIS — R7989 Other specified abnormal findings of blood chemistry: Secondary | ICD-10-CM

## 2019-06-11 DIAGNOSIS — Z124 Encounter for screening for malignant neoplasm of cervix: Secondary | ICD-10-CM

## 2019-06-11 DIAGNOSIS — Z0000 Encounter for general adult medical examination without abnormal findings: Secondary | ICD-10-CM | POA: Diagnosis not present

## 2019-06-11 DIAGNOSIS — R790 Abnormal level of blood mineral: Secondary | ICD-10-CM

## 2019-06-11 MED ORDER — ROPINIROLE HCL 1 MG PO TABS: 1.0000 mg | ORAL_TABLET | Freq: Every day | ORAL | 0 refills | Status: DC

## 2019-06-11 NOTE — Progress Notes (Signed)
Patient: Cheryl Shah, Female    DOB: [birth year], 49 y.o.   MRN: [id number] Visit Date: 06/11/2019  Today's Provider: Trey Sailors, PA-C   Chief Complaint  Patient presents with  . Annual Exam   Subjective:     Annual physical exam Cheryl Shah is a 49 y.o. female who presents today for health maintenance and complete physical. She feels well. She reports no exercise.. She reports she is sleeping fairly well. Noticing some fatigue from medication.   Patient says that when she went to refill rOPINIRole medication she had some trouble with it because it ran out faster and now she is having to take 2 pills now in order for it to be 1mg . Was helping for a while but the past few days but has been worse a bit recently.   Has been started on paxil 10 mg daily for vasomotor symptoms associated with menopause. She reports great success with this.   HTN: Started on 5 mg amlodipine last visit which she is taking daily without issue.   BP Readings from Last 3 Encounters:  06/11/19 (!) 145/95  04/10/19 (!) 136/95  05/30/15 129/90    ----------------------------------------------------------------- Last pap: Hysterectomy 2018 Mammogram: ordered but not completed  Colon Cancer Screening: never done, no family history.    Review of Systems  Constitutional: Negative.   HENT: Negative.   Eyes: Negative.   Respiratory: Negative.   Cardiovascular: Negative.   Gastrointestinal: Negative.   Endocrine: Negative.   Genitourinary: Negative.   Musculoskeletal: Negative.   Skin: Negative.   Allergic/Immunologic: Negative.   Neurological: Negative.   Hematological: Negative.   Psychiatric/Behavioral: Negative.     Social History      She  reports that she quit smoking about 5 months ago. Her smoking use included cigarettes. She has never used smokeless tobacco. She reports current alcohol use of about 2.0 standard drinks of alcohol per week. She reports that she does not use  drugs.       Social History   Socioeconomic History  . Marital status: Married    Spouse name: Not on file  . Number of children: Not on file  . Years of education: Not on file  . Highest education level: Not on file  Occupational History  . Not on file  Tobacco Use  . Smoking status: Former Smoker    Types: Cigarettes    Quit date: 01/06/2019    Years since quitting: 0.4  . Smokeless tobacco: Never Used  Substance and Sexual Activity  . Alcohol use: Yes    Alcohol/week: 2.0 standard drinks    Types: 2 Glasses of wine per week  . Drug use: Never  . Sexual activity: Not on file  Other Topics Concern  . Not on file  Social History Narrative  . Not on file   Social Determinants of Health   Financial Resource Strain:   . Difficulty of Paying Living Expenses: Not on file  Food Insecurity:   . Worried About 03/08/2019 in the Last Year: Not on file  . Ran Out of Food in the Last Year: Not on file  Transportation Needs:   . Lack of Transportation (Medical): Not on file  . Lack of Transportation (Non-Medical): Not on file  Physical Activity:   . Days of Exercise per Week: Not on file  . Minutes of Exercise per Session: Not on file  Stress:   . Feeling of Stress : Not  on file  Social Connections:   . Frequency of Communication with Friends and Family: Not on file  . Frequency of Social Gatherings with Friends and Family: Not on file  . Attends Religious Services: Not on file  . Active Member of Clubs or Organizations: Not on file  . Attends Archivist Meetings: Not on file  . Marital Status: Not on file    Past Medical History:  Diagnosis Date  . Hypertension      There are no problems to display for this patient.   Past Surgical History:  Procedure Laterality Date  . ABDOMINAL HYSTERECTOMY    . TUBAL LIGATION      Family History        Family Status  Relation Name Status  . Mother  (Not Specified)  . Father  (Not Specified)        Her  family history includes Heart Problems in her father and mother.      Allergies  Allergen Reactions  . Sulfa Antibiotics Anaphylaxis  . Bactrim [Sulfamethoxazole-Trimethoprim] Hives and Rash     Current Outpatient Medications:  .  amLODipine (NORVASC) 5 MG tablet, Take 1 tablet (5 mg total) by mouth daily., Disp: 90 tablet, Rfl: 0 .  PARoxetine (PAXIL) 10 MG tablet, TAKE 1 TABLET BY MOUTH EVERY DAY, Disp: 90 tablet, Rfl: 0 .  rOPINIRole (REQUIP) 0.5 MG tablet, Take 0.5 mg one hour before bed x 3 days. Then take 1 mg before bed onward., Disp: 90 tablet, Rfl: 1   Patient Care Team: Paulene Floor as PCP - General (Physician Assistant)    Objective:    Vitals: There were no vitals taken for this visit.  There were no vitals filed for this visit.   Physical Exam Constitutional:      Appearance: Normal appearance.  Cardiovascular:     Rate and Rhythm: Normal rate and regular rhythm.     Heart sounds: Normal heart sounds.  Pulmonary:     Effort: Pulmonary effort is normal.     Breath sounds: Normal breath sounds.  Abdominal:     General: Bowel sounds are normal.     Palpations: Abdomen is soft.  Genitourinary:    Vagina: Normal.     Comments: Cervix surgically absent.  Skin:    General: Skin is warm and dry.  Neurological:     Mental Status: She is alert and oriented to person, place, and time. Mental status is at baseline.  Psychiatric:        Mood and Affect: Mood normal.        Behavior: Behavior normal.      Depression Screen PHQ 2/9 Scores 04/10/2019  PHQ - 2 Score 0  PHQ- 9 Score 2       Assessment & Plan:     Routine Health Maintenance and Physical Exam  Exercise Activities and Dietary recommendations Goals   None     Immunization History  Administered Date(s) Administered  . Influenza-Unspecified 04/03/2019    Health Maintenance  Topic Date Due  . TETANUS/TDAP  01/06/1988  . MAMMOGRAM  01/06/2019  . COLONOSCOPY  01/06/2019  .  INFLUENZA VACCINE  Completed  . HIV Screening  Completed     Discussed health benefits of physical activity, and encouraged her to engage in regular exercise appropriate for her age and condition.    1. Annual physical exam  [facility] contact card provided.   2. Essential hypertension  Largely the same. Will increase  amlodipine to 10 mg QD. Follow up 3 months.  3. Restless leg syndrome  Increase as below. May increase up to 1.5 mg nightly.   - rOPINIRole (REQUIP) 1 MG tablet; Take 1 tablet (1 mg total) by mouth at bedtime.  Dispense: 90 tablet; Refill: 0  4. Colon cancer screening  - Cologuard  5. Elevated ferritin  Iron stores elevated last time without anemia. Will recheck and run test for hemochromatosis. Consider referral to hem/onc pending lab results.   - CBC with Differential  6. Abnormal iron saturation  - Fe+TIBC+Fer - Hemochromatosis DNA-PCR(c282y,h63d) - CBC with Differential  7. Cervical cancer screening  Cervix is surgically absent and this has been confirmed on OBGYN records. No PAP needed in the future.   The entirety of the information documented in the History of Present Illness, Review of Systems and Physical Exam were personally obtained by me. Portions of this information were initially documented by [facility] and reviewed by me for thoroughness and accuracy.   --------------------------------------------------------------------    Trey Sailors, PA-C  [facility]

## 2019-06-11 NOTE — Patient Instructions (Signed)
Health Maintenance After Age 51 After age 51, you are at a higher risk for certain long-term diseases and infections as well as injuries from falls. Falls are a major cause of broken bones and head injuries in people who are older than age 51. Getting regular preventive care can help to keep you healthy and well. Preventive care includes getting regular testing and making lifestyle changes as recommended by your health care provider. Talk with your health care provider about:  Which screenings and tests you should have. A screening is a test that checks for a disease when you have no symptoms.  A diet and exercise plan that is right for you. What should I know about screenings and tests to prevent falls? Screening and testing are the best ways to find a health problem early. Early diagnosis and treatment give you the best chance of managing medical conditions that are common after age 51. Certain conditions and lifestyle choices may make you more likely to have a fall. Your health care provider may recommend:  Regular vision checks. Poor vision and conditions such as cataracts can make you more likely to have a fall. If you wear glasses, make sure to get your prescription updated if your vision changes.  Medicine review. Work with your health care provider to regularly review all of the medicines you are taking, including over-the-counter medicines. Ask your health care provider about any side effects that may make you more likely to have a fall. Tell your health care provider if any medicines that you take make you feel dizzy or sleepy.  Osteoporosis screening. Osteoporosis is a condition that causes the bones to get weaker. This can make the bones weak and cause them to break more easily.  Blood pressure screening. Blood pressure changes and medicines to control blood pressure can make you feel dizzy.  Strength and balance checks. Your health care provider may recommend certain tests to check your  strength and balance while standing, walking, or changing positions.  Foot health exam. Foot pain and numbness, as well as not wearing proper footwear, can make you more likely to have a fall.  Depression screening. You may be more likely to have a fall if you have a fear of falling, feel emotionally low, or feel unable to do activities that you used to do.  Alcohol use screening. Using too much alcohol can affect your balance and may make you more likely to have a fall. What actions can I take to lower my risk of falls? General instructions  Talk with your health care provider about your risks for falling. Tell your health care provider if: ? You fall. Be sure to tell your health care provider about all falls, even ones that seem minor. ? You feel dizzy, sleepy, or off-balance.  Take over-the-counter and prescription medicines only as told by your health care provider. These include any supplements.  Eat a healthy diet and maintain a healthy weight. A healthy diet includes low-fat dairy products, low-fat (lean) meats, and fiber from whole grains, beans, and lots of fruits and vegetables. Home safety  Remove any tripping hazards, such as rugs, cords, and clutter.  Install safety equipment such as grab bars in bathrooms and safety rails on stairs.  Keep rooms and walkways well-lit. Activity   Follow a regular exercise program to stay fit. This will help you maintain your balance. Ask your health care provider what types of exercise are appropriate for you.  If you need a cane or   walker, use it as recommended by your health care provider.  Wear supportive shoes that have nonskid soles. Lifestyle  Do not drink alcohol if your health care provider tells you not to drink.  If you drink alcohol, limit how much you have: ? 0-1 drink a day for women. ? 0-2 drinks a day for men.  Be aware of how much alcohol is in your drink. In the [place] one drink equals one typical bottle of beer (12  oz), one-half glass of wine (5 oz), or one shot of hard liquor (1 oz).  Do not use any products that contain nicotine or tobacco, such as cigarettes and e-cigarettes. If you need help quitting, ask your health care provider. Summary  Having a healthy lifestyle and getting preventive care can help to protect your health and wellness after age 51.  Screening and testing are the best way to find a health problem early and help you avoid having a fall. Early diagnosis and treatment give you the best chance for managing medical conditions that are more common for people who are older than age 51.  Falls are a major cause of broken bones and head injuries in people who are older than age 51. Take precautions to prevent a fall at home.  Work with your health care provider to learn what changes you can make to improve your health and wellness and to prevent falls. This information is not intended to replace advice given to you by your health care provider. Make sure you discuss any questions you have with your health care provider. Document Revised: 09/07/2018 Document Reviewed: 03/30/2017 Elsevier Patient Education  2020 [employer]vier Inc[employer]  

## 2019-06-12 MED ORDER — AMLODIPINE BESYLATE 10 MG PO TABS: 10.0000 mg | ORAL_TABLET | Freq: Every day | ORAL | 0 refills | Status: DC

## 2019-06-15 LAB — CBC WITH DIFFERENTIAL/PLATELET
Basophils Absolute: 0.1 10*3/uL (ref 0.0–0.2)
Basos: 0 %
EOS (ABSOLUTE): 0.2 10*3/uL (ref 0.0–0.4)
Eos: 2 %
Hematocrit: 47 % — ABNORMAL HIGH (ref 34.0–46.6)
Hemoglobin: 15.4 g/dL (ref 11.1–15.9)
Immature Grans (Abs): 0 10*3/uL (ref 0.0–0.1)
Immature Granulocytes: 0 %
Lymphocytes Absolute: 2.6 10*3/uL (ref 0.7–3.1)
Lymphs: 22 %
MCH: 31.9 pg (ref 26.6–33.0)
MCHC: 32.8 g/dL (ref 31.5–35.7)
MCV: 97 fL (ref 79–97)
Monocytes Absolute: 0.6 10*3/uL (ref 0.1–0.9)
Monocytes: 5 %
Neutrophils Absolute: 8.3 10*3/uL — ABNORMAL HIGH (ref 1.4–7.0)
Neutrophils: 71 %
Platelets: 440 10*3/uL (ref 150–450)
RBC: 4.83 x10E6/uL (ref 3.77–5.28)
RDW: 12.6 % (ref 11.7–15.4)
WBC: 11.8 10*3/uL — ABNORMAL HIGH (ref 3.4–10.8)

## 2019-06-15 LAB — IRON,TIBC AND FERRITIN PANEL
Ferritin: 346 ng/mL — ABNORMAL HIGH (ref 15–150)
Iron Saturation: 56 % — ABNORMAL HIGH (ref 15–55)
Iron: 158 ug/dL (ref 27–159)
Total Iron Binding Capacity: 282 ug/dL (ref 250–450)
UIBC: 124 ug/dL — ABNORMAL LOW (ref 131–425)

## 2019-06-15 LAB — HEMOCHROMATOSIS DNA-PCR(C282Y,H63D)

## 2019-07-02 ENCOUNTER — Other Ambulatory Visit: Payer: Self-pay | Admitting: Physician Assistant

## 2019-07-02 DIAGNOSIS — I10 Essential (primary) hypertension: Secondary | ICD-10-CM

## 2019-07-09 ENCOUNTER — Other Ambulatory Visit: Payer: Self-pay | Admitting: Physician Assistant

## 2019-07-09 DIAGNOSIS — G2581 Restless legs syndrome: Secondary | ICD-10-CM

## 2019-08-08 ENCOUNTER — Telehealth: Payer: Self-pay | Admitting: Physician Assistant

## 2019-08-08 NOTE — Telephone Encounter (Signed)
error 

## 2019-09-10 ENCOUNTER — Other Ambulatory Visit: Payer: Self-pay

## 2019-09-10 ENCOUNTER — Encounter: Payer: Self-pay | Admitting: Physician Assistant

## 2019-09-10 ENCOUNTER — Ambulatory Visit (INDEPENDENT_AMBULATORY_CARE_PROVIDER_SITE_OTHER): Payer: 59 | Admitting: Physician Assistant

## 2019-09-10 VITALS — BP 130/80 | HR 80 | Temp 96.6°F | Wt 171.0 lb

## 2019-09-10 DIAGNOSIS — R232 Flushing: Secondary | ICD-10-CM | POA: Diagnosis not present

## 2019-09-10 DIAGNOSIS — G2581 Restless legs syndrome: Secondary | ICD-10-CM | POA: Diagnosis not present

## 2019-09-10 DIAGNOSIS — I10 Essential (primary) hypertension: Secondary | ICD-10-CM | POA: Diagnosis not present

## 2019-09-10 DIAGNOSIS — Z1211 Encounter for screening for malignant neoplasm of colon: Secondary | ICD-10-CM

## 2019-09-10 DIAGNOSIS — Z148 Genetic carrier of other disease: Secondary | ICD-10-CM

## 2019-09-10 MED ORDER — PAROXETINE HCL 10 MG PO TABS: 10.0000 mg | ORAL_TABLET | Freq: Every day | ORAL | 1 refills | Status: DC

## 2019-09-10 MED ORDER — ROPINIROLE HCL 1 MG PO TABS: 1.0000 mg | ORAL_TABLET | Freq: Every day | ORAL | 1 refills | Status: DC

## 2019-09-10 MED ORDER — AMLODIPINE BESYLATE 10 MG PO TABS: 10.0000 mg | ORAL_TABLET | Freq: Every day | ORAL | 1 refills | Status: DC

## 2019-09-10 NOTE — Progress Notes (Addendum)
Patient: Cheryl Shah Female    DOB: 09/16/1970   49 y.o.   MRN: [id number] Visit Date: 09/10/2019  Today's Provider: Trey Sailors, PA-C   Chief Complaint  Patient presents with  . Hypertension   Subjective:    I, Cheryl Shah,CMA am acting as a scribe for [place].   HPI  Hypertension, follow-up:  BP Readings from Last 3 Encounters:  09/10/19 130/80  06/11/19 (!) 145/95  04/10/19 (!) 136/95    She was last seen for hypertension 3 months ago.  BP at that visit was 145/95. Management changes since that visit include increased Amlodipine to 10 mg. She reports good compliance with treatment. She is not having side effects.  She is exercising. She is adherent to low salt diet.   Outside blood pressures are not begin checked. She is experiencing none.  Patient denies chest pain, chest pressure/discomfort, exertional chest pressure/discomfort, fatigue, irregular heart beat and lower extremity edema.   Cardiovascular risk factors include hypertension.  Use of agents associated with hypertension: none.     Weight trend: stable Wt Readings from Last 3 Encounters:  09/10/19 171 lb (77.6 kg)  06/11/19 171 lb (77.6 kg)  04/10/19 164 lb 3.2 oz (74.5 kg)    Current diet: well balanced  ------------------------------------------------------------------------  Restless Leg Syndrome Patient presents today for restless leg syndrome. Patient last office visit was on 06/11/2019 and her Requip was increased to 1.5 MG nightly.  Continues to do well with Paxil for hot flashes.   She lost her cologuard box in a move and she needs a new one ordered.   She has not looked over in detail her hemachromatosis labwork.   Allergies  Allergen Reactions  . Sulfa Antibiotics Anaphylaxis  . Bactrim [Sulfamethoxazole-Trimethoprim] Hives and Rash     Current Outpatient Medications:  .  amLODipine (NORVASC) 10 MG tablet, Take 1 tablet (10 mg total) by mouth  daily., Disp: 90 tablet, Rfl: 0 .  PARoxetine (PAXIL) 10 MG tablet, TAKE 1 TABLET BY MOUTH EVERY DAY, Disp: 90 tablet, Rfl: 0 .  rOPINIRole (REQUIP) 1 MG tablet, Take 1 tablet (1 mg total) by mouth at bedtime., Disp: 90 tablet, Rfl: 0  Review of Systems  Constitutional: Negative.   Respiratory: Negative.   Cardiovascular: Negative.   Neurological: Negative.     Social History   Tobacco Use  . Smoking status: Former Smoker    Types: Cigarettes    Quit date: 01/06/2019    Years since quitting: 0.6  . Smokeless tobacco: Never Used  Substance Use Topics  . Alcohol use: Yes    Alcohol/week: 2.0 standard drinks    Types: 2 Glasses of wine per week      Objective:   BP 130/80 (BP Location: Left Arm, Patient Position: Sitting)   Pulse 80   Temp (!) 96.6 F (35.9 C) (Temporal)   Wt 171 lb (77.6 kg)   SpO2 98%   BMI 26.78 kg/m  Vitals:   09/10/19 1506  BP: 130/80  Pulse: 80  Temp: (!) 96.6 F (35.9 C)  TempSrc: Temporal  SpO2: 98%  Weight: 171 lb (77.6 kg)  Body mass index is 26.78 kg/m.   Physical Exam Constitutional:      Appearance: Normal appearance.  Cardiovascular:     Rate and Rhythm: Normal rate and regular rhythm.     Heart sounds: Normal heart sounds.  Pulmonary:     Effort: Pulmonary effort is normal.  Breath sounds: Normal breath sounds.  Skin:    General: Skin is warm and dry.  Neurological:     Mental Status: She is alert and oriented to person, place, and time. Mental status is at baseline.  Psychiatric:        Mood and Affect: Mood normal.        Behavior: Behavior normal.      No results found for any visits on 09/10/19.     Assessment & Plan    1. Essential hypertension  Well controlled Continue current medications Recheck metabolic panel F/u in 6 months  - amLODipine (NORVASC) 10 MG tablet; Take 1 tablet (10 mg total) by mouth daily.  Dispense: 90 tablet; Refill: 1  2. Hot flashes  - PARoxetine (PAXIL) 10 MG tablet; Take 1  tablet (10 mg total) by mouth daily.  Dispense: 90 tablet; Refill: 1  3. Restless leg syndrome  - rOPINIRole (REQUIP) 1 MG tablet; Take 1 tablet (1 mg total) by mouth at bedtime.  Dispense: 90 tablet; Refill: 1  4. Colon cancer screening  Reorder.  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 That in conjunction with elevated iron levels support hemachromatosis diagnosis. Her aunt on her mother's side is positive for hemachromatosis. I have explained the risks associated with this disease and that her children are carriers. I have recommended referral to hematology/oncology for further eval and treatment. Counseled n systemic consequences of not treating this illness. She declines at this time.   The entirety of the information documented in the History of Present Illness, Review of Systems and Physical Exam were personally obtained by me. Portions of this information were initially documented by [facility] and reviewed by me for thoroughness and accuracy.        Trinna Post, PA-C  [facility]

## 2019-09-10 NOTE — Patient Instructions (Signed)

## 2020-03-11 ENCOUNTER — Ambulatory Visit: Payer: Self-pay | Admitting: Physician Assistant

## 2021-01-27 ENCOUNTER — Ambulatory Visit: Payer: Self-pay | Admitting: *Deleted

## 2021-01-27 NOTE — Telephone Encounter (Signed)
Patient is calling to report she has had diarrhea for 2 weeks and she had vomiting last week. Patient states she had watery stools with cramping all week last week. This was accompanied with vomiting. Her symptoms stopped for 2 days and then restarted this morning. Patient advised need appointment- advised UC - no appointment available in office.  Reason for Disposition  Abdominal pain  (Exception: Pain clears with each passage of diarrhea stool)  Answer Assessment - Initial Assessment Questions 1. DIARRHEA SEVERITY: "How bad is the diarrhea?" "How many more stools have you had in the past 24 hours than normal?"    - NO DIARRHEA (SCALE 0)   - MILD (SCALE 1-3): Few loose or mushy BMs; increase of 1-3 stools over normal daily number of stools; mild increase in ostomy output.   -  MODERATE (SCALE 4-7): Increase of 4-6 stools daily over normal; moderate increase in ostomy output. * SEVERE (SCALE 8-10; OR 'WORST POSSIBLE'): Increase of 7 or more stools daily over normal; moderate increase in ostomy output; incontinence.     mild 2. ONSET: "When did the diarrhea begin?"      2 weeks 3. BM CONSISTENCY: "How loose or watery is the diarrhea?"      watery 4. VOMITING: "Are you also vomiting?" If Yes, ask: "How many times in the past 24 hours?"      Vomiting last week- M/T, no vomiting today- nauseous 5. ABDOMINAL PAIN: "Are you having any abdominal pain?" If Yes, ask: "What does it feel like?" (e.g., crampy, dull, intermittent, constant)      Cramping 6. ABDOMINAL PAIN SEVERITY: If present, ask: "How bad is the pain?"  (e.g., Scale 1-10; mild, moderate, or severe)   - MILD (1-3): doesn't interfere with normal activities, abdomen soft and not tender to touch    - MODERATE (4-7): interferes with normal activities or awakens from sleep, abdomen tender to touch    - SEVERE (8-10): excruciating pain, doubled over, unable to do any normal activities       moderate 7. ORAL INTAKE: If vomiting, "Have you been  able to drink liquids?" "How much liquids have you had in the past 24 hours?"     Ginger ale today, tea 8. HYDRATION: "Any signs of dehydration?" (e.g., dry mouth [not just dry lips], too weak to stand, dizziness, new weight loss) "When did you last urinate?"     2 days free of diarrhea- patient states she has been trying to stay hydrated- dry mouth is present 9. EXPOSURE: "Have you traveled to a foreign country recently?" "Have you been exposed to anyone with diarrhea?" "Could you have eaten any food that was spoiled?"     no 10. ANTIBIOTIC USE: "Are you taking antibiotics now or have you taken antibiotics in the past 2 months?"       no 11. OTHER SYMPTOMS: "Do you have any other symptoms?" (e.g., fever, blood in stool)       vomiting 12. PREGNANCY: "Is there any chance you are pregnant?" "When was your last menstrual period?"       N/a  Protocols used: [facility]

## 2021-01-27 NOTE — Telephone Encounter (Signed)
FYI

## 2021-01-27 NOTE — Telephone Encounter (Signed)
Summary: diarrhea   Patient is experiencing diarrhea and mild pain in abdomen, no appt available until November with Dr Brett Canales. She prefers woman Dr.. Please call back     Attempted to call patient- left message to call office

## 2022-09-20 ENCOUNTER — Emergency Department
Admission: EM | Admit: 2022-09-20 | Discharge: 2022-09-20 | Disposition: A | Discharge status: Home / Self Care | Payer: BC Managed Care – PPO | Attending: Emergency Medicine | Admitting: Emergency Medicine

## 2022-09-20 ENCOUNTER — Other Ambulatory Visit: Payer: Self-pay

## 2022-09-20 ENCOUNTER — Encounter: Payer: Self-pay | Admitting: Emergency Medicine

## 2022-09-20 DIAGNOSIS — R9431 Abnormal electrocardiogram [ECG] [EKG]: Secondary | ICD-10-CM | POA: Diagnosis not present

## 2022-09-20 DIAGNOSIS — R112 Nausea with vomiting, unspecified: Secondary | ICD-10-CM | POA: Diagnosis not present

## 2022-09-20 DIAGNOSIS — I10 Essential (primary) hypertension: Secondary | ICD-10-CM | POA: Diagnosis not present

## 2022-09-20 DIAGNOSIS — K529 Noninfective gastroenteritis and colitis, unspecified: Secondary | ICD-10-CM | POA: Insufficient documentation

## 2022-09-20 DIAGNOSIS — Z1152 Encounter for screening for COVID-19: Secondary | ICD-10-CM | POA: Insufficient documentation

## 2022-09-20 LAB — COMPREHENSIVE METABOLIC PANEL
ALT: 28 U/L (ref 0–44)
AST: 20 U/L (ref 15–41)
Albumin: 4.4 g/dL (ref 3.5–5.0)
Alkaline Phosphatase: 118 U/L (ref 38–126)
Anion gap: 10 (ref 5–15)
BUN: 18 mg/dL (ref 6–20)
CO2: 26 mmol/L (ref 22–32)
Calcium: 9.8 mg/dL (ref 8.9–10.3)
Chloride: 105 mmol/L (ref 98–111)
Creatinine, Ser: 0.8 mg/dL (ref 0.44–1.00)
GFR, Estimated: >60 mL/min (ref >60)
Glucose, Bld: 106 mg/dL — ABNORMAL HIGH (ref 70–99)
Potassium: 3.7 mmol/L (ref 3.5–5.1)
Sodium: 141 mmol/L (ref 135–145)
Total Bilirubin: 1 mg/dL (ref 0.3–1.2)
Total Protein: 8.2 g/dL — ABNORMAL HIGH (ref 6.5–8.1)

## 2022-09-20 LAB — CBC
HCT: 47.6 % — ABNORMAL HIGH (ref 36.0–46.0)
Hemoglobin: 15.7 g/dL — ABNORMAL HIGH (ref 12.0–15.0)
MCH: 30.2 pg (ref 26.0–34.0)
MCHC: 33 g/dL (ref 30.0–36.0)
MCV: 91.5 fL (ref 80.0–100.0)
Platelets: 512 10*3/uL — ABNORMAL HIGH (ref 150–400)
RBC: 5.2 MIL/uL — ABNORMAL HIGH (ref 3.87–5.11)
RDW: 12.1 % (ref 11.5–15.5)
WBC: 11.2 10*3/uL — ABNORMAL HIGH (ref 4.0–10.5)
nRBC: 0 % (ref 0.0–0.2)

## 2022-09-20 LAB — LIPASE, BLOOD: Lipase: 27 U/L (ref 11–51)

## 2022-09-20 LAB — SARS CORONAVIRUS 2 BY RT PCR: SARS Coronavirus 2 by RT PCR: NEGATIVE

## 2022-09-20 MED ORDER — SODIUM CHLORIDE 0.9 % IV BOLUS
1000.0000 mL | Freq: Once | INTRAVENOUS | Status: AC
  Administered 2022-09-20: 1000 mL via INTRAVENOUS

## 2022-09-20 MED ORDER — ONDANSETRON 4 MG PO TBDP: 4.0000 mg | ORAL_TABLET | Freq: Three times a day (TID) | ORAL | 0 refills | Status: AC | PRN

## 2022-09-20 MED ORDER — ACETAMINOPHEN 500 MG PO TABS: 1000.0000 mg | ORAL_TABLET | Freq: Once | ORAL | Status: DC

## 2022-09-20 MED ORDER — PANTOPRAZOLE SODIUM 40 MG PO TBEC: 40.0000 mg | DELAYED_RELEASE_TABLET | Freq: Every day | ORAL | 0 refills | Status: DC

## 2022-09-20 MED ORDER — ONDANSETRON HCL 4 MG/2ML IJ SOLN
4.0000 mg | Freq: Once | INTRAMUSCULAR | Status: AC
  Administered 2022-09-20: 4 mg via INTRAVENOUS
  Filled 2022-09-20: qty 2

## 2022-09-20 MED ORDER — PANTOPRAZOLE SODIUM 40 MG IV SOLR
40.0000 mg | Freq: Once | INTRAVENOUS | Status: AC
  Administered 2022-09-20: 40 mg via INTRAVENOUS
  Filled 2022-09-20: qty 10

## 2022-09-20 NOTE — Discharge Instructions (Addendum)
Your workup was reassuring we discussed CT imaging but opted to hold off given no abdominal pain but if you develop increasing abdominal pain please return to the ER for repeat evaluation to get CT imaging.  I suspect this is most likely viral gastroenteritis.  You take Zofran, use Pedialyte and return to the ER if develop worsening- can have repeat abdominal exam tomorrow with PCP or return here if any concerns. I have started on protonix to help decrease acid in stomach.

## 2022-09-20 NOTE — ED Triage Notes (Signed)
Patient to ED via POV for lower abd pain since Thursday. N/V/D starting yesterday- unable to keep anything down. Also c/o lightheadedness

## 2022-09-20 NOTE — ED Provider Notes (Addendum)
[facility] Provider Note    Event Date/Time   First MD Initiated Contact with Patient 09/20/22 [phone]     (approximate)   History   Abdominal Pain   HPI  Cheryl Shah is a 52 y.o. female with history of hypertension who comes in with nausea vomiting diarrhea.  Patient reports that she started the weight loss medication ozempic in January.  She reports that she takes it weekly.  She reports that last Thursday she had a slightly increased dose.  She reports some intermittent sharp abdominal discomfort since then but really having the diarrhea start yesterday.  Patient reports multiple episodes of watery yellowish diarrhea that started yesterday.  She reports then developing some nausea, vomiting as well.  She reports multiple episodes of vomiting unable to keep anything down.  She reports initially the vomiting seem normal in nature but then became more of a brown color.  Does report however eating peanut butter and bread yesterday.  She reports feeling lightheaded when she tries to ambulate or walk.  Denies any chest pain, shortness of breath, falls, hitting her head or any other concerns.  Denies any urinary symptoms.   Physical Exam   Triage Vital Signs: Blood pressure (!) 135/107, pulse (!) 105, temperature 97.9 F (36.6 C), temperature source Oral, resp. rate 18, height  (1.702 m), weight 65.8 kg, SpO2 100 %.   Most recent vital signs: Vitals:   09/20/22 0708 09/20/22 0710  BP:  (!) 135/107  Pulse: (!) 105   Resp: 18   Temp: 97.9 F (36.6 C)   SpO2: 100%      General: Awake, no distress.  CV:  Good peripheral perfusion.  Resp:  Normal effort.  Abd:  No distention.  Soft nontender Other:     ED Results / Procedures / Treatments   Labs (all labs ordered are listed, but only abnormal results are displayed) Labs Reviewed  COMPREHENSIVE METABOLIC PANEL - Abnormal; Notable for the following components:      Result Value   Glucose, Bld  106 (*)    Total Protein 8.2 (*)    All other components within normal limits  CBC - Abnormal; Notable for the following components:   WBC 11.2 (*)    RBC 5.20 (*)    Hemoglobin 15.7 (*)    HCT 47.6 (*)    Platelets 512 (*)    All other components within normal limits  SARS CORONAVIRUS 2 BY RT PCR  GASTROINTESTINAL PANEL BY PCR, STOOL (REPLACES STOOL CULTURE)  C DIFFICILE QUICK SCREEN W PCR REFLEX    LIPASE, BLOOD  URINALYSIS, ROUTINE W REFLEX MICROSCOPIC     EKG  My interpretation of EKG:  Normal sinus rate of 96, no st elevation, twi in lead 3, normal intervals   RADIOLOGY  PROCEDURES:  Critical Care performed: No  .1-3 Lead EKG Interpretation  Performed by: Concha Se, MD Authorized by: Concha Se, MD     Interpretation: normal     ECG rate:  90   ECG rate assessment: normal     Rhythm: sinus rhythm     Ectopy: none     Conduction: normal      MEDICATIONS ORDERED IN ED: Medications  acetaminophen (TYLENOL) tablet 1,000 mg (has no administration in time range)  sodium chloride 0.9 % bolus 1,000 mL (has no administration in time range)  sodium chloride 0.9 % bolus 1,000 mL (1,000 mLs Intravenous New Bag/Given 09/20/22 0741)  ondansetron (  ZOFRAN) injection 4 mg (4 mg Intravenous Given 09/20/22 0742)  pantoprazole (PROTONIX) injection 40 mg (40 mg Intravenous Given 09/20/22 0742)     IMPRESSION / MDM / ASSESSMENT AND PLAN / ED COURSE  I reviewed the triage vital signs and the nursing notes.   Patient's presentation is most consistent with acute presentation with potential threat to life or bodily function.   Patient comes in initially slightly tachycardic but coming down with fluids with nausea vomiting diarrhea since yesterday associated with some intermittent lower abdominal pain.  Currently abdomen soft nontender discussed CT imaging will hold off at this time and treat symptomatically given lower suspicion for perforation, appendicitis, diverticulitis.   She reports that when she does get pain is on the left side but she denies any history of diverticulitis.  She reported some dark brownish vomit and asked if it could be blood.  We discussed Mallory-Weiss tears.  She denies any black tarry stool declines rectal exam.  She has had no vomiting here to evaluate for any blood and I did talk to her how it could be related to what she had eaten.  Patient was given some fluids, Zofran, Protonix.  COVID test is negative.  CMP reassuring with normal creatinine.  CBC shows stable hemoglobin around baseline at 15.7 slightly elevated white count and platelets could be reactive from the nausea and vomiting.  Lipase is normal.  8:56 AM repeat abdominal exam she reports some cramping but on palpation she denies any tenderness we discussed pros and cons of CT imaging but at this time she like to hold off.  We attempted to stand patient up and she still feeling somewhat lightheaded.  She has had no diarrhea or vomiting since being here to test.  Will give additional 1 L of fluid and do p.o. challenge at this time she like to hold off on CT imaging given I suspect this is more likely a viral gastroenteritis.  10:02 AM patient reports feeling much better ambulatory without any lightheadedness.  She was able to give a small stool sample that is not black or melanotic in nature.  She has had no additional vomiting here.  Discussed with patient that if she develops any blood in the vomit to return for recheck hemoglobin which she expressed understanding.  I will start her on a PPI to decrease any acid.  We rediscussed CT imaging and she still would like to hold off.  She is tolerated eating and drinking and feels comfortable with discharge home and will return if symptoms are worsening or changing in any way  Urine sample mixed in with stools unable to get but she denies any urinary symptoms.  The patient is on the cardiac monitor to evaluate for evidence of arrhythmia and/or  significant heart rate changes.      FINAL CLINICAL IMPRESSION(S) / ED DIAGNOSES   Final diagnoses:  Gastroenteritis     Rx / DC Orders   ED Discharge Orders          Ordered    ondansetron (ZOFRAN-ODT) 4 MG disintegrating tablet  Every 8 hours PRN        09/20/22 0958    pantoprazole (PROTONIX) 40 MG tablet  Daily        09/20/22 1001             Note:  This document was prepared using Dragon voice recognition software and may include unintentional dictation errors.   Concha Se, MD 09/20/22 1003  Concha Se, MD 09/20/22 1003

## 2022-10-06 DIAGNOSIS — N8111 Cystocele, midline: Secondary | ICD-10-CM | POA: Diagnosis not present

## 2022-10-06 DIAGNOSIS — N816 Rectocele: Secondary | ICD-10-CM | POA: Diagnosis not present

## 2022-10-09 ENCOUNTER — Other Ambulatory Visit: Payer: Self-pay

## 2022-10-09 ENCOUNTER — Emergency Department
Admission: EM | Admit: 2022-10-09 | Discharge: 2022-10-09 | Disposition: A | Discharge status: Home / Self Care | Payer: BC Managed Care – PPO | Attending: Emergency Medicine | Admitting: Emergency Medicine

## 2022-10-09 ENCOUNTER — Emergency Department: Payer: BC Managed Care – PPO

## 2022-10-09 DIAGNOSIS — R1032 Left lower quadrant pain: Secondary | ICD-10-CM | POA: Insufficient documentation

## 2022-10-09 DIAGNOSIS — I10 Essential (primary) hypertension: Secondary | ICD-10-CM | POA: Insufficient documentation

## 2022-10-09 DIAGNOSIS — R109 Unspecified abdominal pain: Secondary | ICD-10-CM

## 2022-10-09 DIAGNOSIS — R112 Nausea with vomiting, unspecified: Secondary | ICD-10-CM | POA: Diagnosis not present

## 2022-10-09 DIAGNOSIS — R197 Diarrhea, unspecified: Secondary | ICD-10-CM | POA: Diagnosis not present

## 2022-10-09 LAB — COMPREHENSIVE METABOLIC PANEL
ALT: 32 U/L (ref 0–44)
AST: 23 U/L (ref 15–41)
Albumin: 4.6 g/dL (ref 3.5–5.0)
Alkaline Phosphatase: 116 U/L (ref 38–126)
Anion gap: 6 (ref 5–15)
BUN: 15 mg/dL (ref 6–20)
CO2: 25 mmol/L (ref 22–32)
Calcium: 9.6 mg/dL (ref 8.9–10.3)
Chloride: 107 mmol/L (ref 98–111)
Creatinine, Ser: 0.75 mg/dL (ref 0.44–1.00)
GFR, Estimated: >60 mL/min (ref >60)
Glucose, Bld: 93 mg/dL (ref 70–99)
Potassium: 4.1 mmol/L (ref 3.5–5.1)
Sodium: 138 mmol/L (ref 135–145)
Total Bilirubin: 1 mg/dL (ref 0.3–1.2)
Total Protein: 7.5 g/dL (ref 6.5–8.1)

## 2022-10-09 LAB — URINALYSIS, ROUTINE W REFLEX MICROSCOPIC
Bilirubin Urine: NEGATIVE
Glucose, UA: NEGATIVE mg/dL
Hgb urine dipstick: NEGATIVE
Ketones, ur: NEGATIVE mg/dL
Leukocytes,Ua: NEGATIVE
Nitrite: NEGATIVE
Protein, ur: NEGATIVE mg/dL
Specific Gravity, Urine: >1.046 — ABNORMAL HIGH (ref 1.005–1.030)
pH: 5 (ref 5.0–8.0)

## 2022-10-09 LAB — CBC
HCT: 46 % (ref 36.0–46.0)
Hemoglobin: 14.8 g/dL (ref 12.0–15.0)
MCH: 30 pg (ref 26.0–34.0)
MCHC: 32.2 g/dL (ref 30.0–36.0)
MCV: 93.1 fL (ref 80.0–100.0)
Platelets: 451 10*3/uL — ABNORMAL HIGH (ref 150–400)
RBC: 4.94 MIL/uL (ref 3.87–5.11)
RDW: 12.6 % (ref 11.5–15.5)
WBC: 8.4 10*3/uL (ref 4.0–10.5)
nRBC: 0 % (ref 0.0–0.2)

## 2022-10-09 LAB — LIPASE, BLOOD: Lipase: 37 U/L (ref 11–51)

## 2022-10-09 MED ORDER — IOHEXOL 300 MG/ML  SOLN
100.0000 mL | Freq: Once | INTRAMUSCULAR | Status: AC | PRN
  Administered 2022-10-09: 100 mL via INTRAVENOUS

## 2022-10-09 MED ORDER — KETOROLAC TROMETHAMINE 30 MG/ML IJ SOLN
30.0000 mg | Freq: Once | INTRAMUSCULAR | Status: AC
  Administered 2022-10-09: 30 mg via INTRAVENOUS
  Filled 2022-10-09: qty 1

## 2022-10-09 MED ORDER — AMOXICILLIN-POT CLAVULANATE 875-125 MG PO TABS: 1.0000 | ORAL_TABLET | Freq: Two times a day (BID) | ORAL | 0 refills | Status: DC

## 2022-10-09 MED ORDER — ONDANSETRON 4 MG PO TBDP: 4.0000 mg | ORAL_TABLET | Freq: Three times a day (TID) | ORAL | 0 refills | Status: DC | PRN

## 2022-10-09 MED ORDER — DICYCLOMINE HCL 10 MG PO CAPS: 10.0000 mg | ORAL_CAPSULE | Freq: Three times a day (TID) | ORAL | 0 refills | Status: DC | PRN

## 2022-10-09 MED ORDER — ONDANSETRON HCL 4 MG/2ML IJ SOLN
4.0000 mg | Freq: Once | INTRAMUSCULAR | Status: AC
  Administered 2022-10-09: 4 mg via INTRAVENOUS
  Filled 2022-10-09: qty 2

## 2022-10-09 MED ORDER — SODIUM CHLORIDE 0.9 % IV BOLUS
1000.0000 mL | Freq: Once | INTRAVENOUS | Status: AC
  Administered 2022-10-09: 1000 mL via INTRAVENOUS

## 2022-10-09 NOTE — ED Provider Notes (Signed)
[facility] Provider Note    Event Date/Time   First MD Initiated Contact with Patient 10/09/22 [phone]     (approximate)  History   Chief Complaint: Abdominal Pain and Emesis  HPI  Cheryl Shah is a 52 y.o. female with a past medical history of hypertension who presents to the emergency department for nausea vomiting diarrhea and left lower quadrant abdominal pain.  According to the patient she woke up around 4:00 this morning with nausea vomiting has had multiple episodes of diarrhea and is experiencing cramping mostly in the left lower quadrant.  Patient states approximately 3 weeks ago she had the same symptoms.  She was seen after approximately 1 week of symptoms, states she felt better and refused a CT scan at that time.  She has been well over the last 2 weeks or so however this morning she began with symptoms all over again including left lower quadrant abdominal pain.  Denies any fever.  No urinary symptoms.  Physical Exam   Triage Vital Signs: ED Triage Vitals  Enc Vitals Group     BP 10/09/22 0649 (!) 127/98     Pulse Rate 10/09/22 0649 88     Resp 10/09/22 0649 17     Temp 10/09/22 0649 97.7 F (36.5 C)     Temp Source 10/09/22 0649 Oral     SpO2 10/09/22 0649 100 %     Weight 10/09/22 0648 143 lb (64.9 kg)     Height 10/09/22 0648 5\' 7"  (1.702 m)     Head Circumference --      Peak Flow --      Pain Score 10/09/22 0650 10     Pain Loc --      Pain Edu? --      Excl. in GC? --     Most recent vital signs: Vitals:   10/09/22 0649  BP: (!) 127/98  Pulse: 88  Resp: 17  Temp: 97.7 F (36.5 C)  SpO2: 100%    General: Awake, no distress.  CV:  Good peripheral perfusion.  Regular rate and rhythm  Resp:  Normal effort.  Equal breath sounds bilaterally.  Abd:  No distention.  Soft, mild left lower quadrant tenderness.  No rebound or guarding.  ED Results / Procedures / Treatments   RADIOLOGY  I have reviewed and interpreted CT  images.  No obvious obstruction or significant abnormality seen on my evaluation. Radiology has read the CT scan as no acute finding however the cecum is displaced left upper quadrant possible internal hernia but no sign of obstruction.   MEDICATIONS ORDERED IN ED: Medications  sodium chloride 0.9 % bolus 1,000 mL (has no administration in time range)  ondansetron (ZOFRAN) injection 4 mg (has no administration in time range)     IMPRESSION / MDM / ASSESSMENT AND PLAN / ED COURSE  I reviewed the triage vital signs and the nursing notes.  Patient's presentation is most consistent with acute presentation with potential threat to life or bodily function.  Patient presents emergency department for nausea vomiting diarrhea and left lower quadrant abdominal pain.  Mild tenderness on exam.  Vital signs reassuring.  We will check labs, IV hydrate treat with Zofran.  I discussed with the patient given recurrent symptoms and left lower quadrant pain recommending CT scan, patient agreeable as well.  Differential would include diverticulitis, colitis, gastroenteritis, biliary disease, pancreatitis.  Patient's workup is overall reassuring, urinalysis is normal, lipase is normal chemistry is  normal including LFTs and CBC is normal with a normal white blood cell count.  CT scan is resulted overall normal as well patient does have a displaced cecum and left upper quadrant but no sign of obstruction.  I discussed with the patient follow-up with GI medicine as she may need a colonoscopy, patient has never had a colonoscopy.  We will treat the patient with Bentyl and Zofran.  Patient is concerned because [place] is leaving the country on [place], given the recurrent nature of her symptoms I discussed a trial of antibiotics such as Augmentin twice daily.  Patient will follow-up with GI medicine as well as her doctor.  Discussed return precautions.  FINAL CLINICAL IMPRESSION(S) / ED DIAGNOSES   Nausea vomiting  diarrhea Abdominal pain  Note:  This document was prepared using Dragon voice recognition software and may include unintentional dictation errors.   Minna Antis, MD 10/09/22 [phone]

## 2022-10-09 NOTE — ED Triage Notes (Signed)
Pt presents to ER with c/o lower abd pain, and n/v/d that woke pt from her sleep around 0430.  Pt states she vomited x3 this morning, along with multiple episodes of diarrhea.  Pt states she was seen here appx 3 weeks ago for similar sx abd states this feels nearly the same as last time.  Pt ambulatory on arrival, and is A&O x4 and in NAD.

## 2022-10-11 DIAGNOSIS — R109 Unspecified abdominal pain: Secondary | ICD-10-CM | POA: Diagnosis not present

## 2022-10-11 DIAGNOSIS — K921 Melena: Secondary | ICD-10-CM | POA: Diagnosis not present

## 2022-10-11 DIAGNOSIS — R112 Nausea with vomiting, unspecified: Secondary | ICD-10-CM | POA: Diagnosis not present

## 2022-10-11 DIAGNOSIS — T50995A Adverse effect of other drugs, medicaments and biological substances, initial encounter: Secondary | ICD-10-CM | POA: Diagnosis not present

## 2022-10-11 DIAGNOSIS — R197 Diarrhea, unspecified: Secondary | ICD-10-CM | POA: Diagnosis not present

## 2022-10-11 DIAGNOSIS — R1032 Left lower quadrant pain: Secondary | ICD-10-CM | POA: Diagnosis not present

## 2022-10-11 DIAGNOSIS — X58XXXA Exposure to other specified factors, initial encounter: Secondary | ICD-10-CM | POA: Diagnosis not present

## 2022-10-22 DIAGNOSIS — R197 Diarrhea, unspecified: Secondary | ICD-10-CM | POA: Diagnosis not present

## 2022-10-22 DIAGNOSIS — R112 Nausea with vomiting, unspecified: Secondary | ICD-10-CM | POA: Diagnosis not present

## 2022-10-22 DIAGNOSIS — R933 Abnormal findings on diagnostic imaging of other parts of digestive tract: Secondary | ICD-10-CM | POA: Diagnosis not present

## 2022-10-22 DIAGNOSIS — Z1211 Encounter for screening for malignant neoplasm of colon: Secondary | ICD-10-CM | POA: Diagnosis not present

## 2022-11-15 ENCOUNTER — Other Ambulatory Visit: Payer: Self-pay | Admitting: Obstetrics and Gynecology

## 2022-11-15 NOTE — H&P (Signed)
Cheryl Shah is a 52 y.o. female here for Discuss treatment for cystocele .scheduled for anterior and posterior colporrhaphy    Pt seen by me 1/23 and I documented a 1-2 grade cystocele and 1st degree cystocele. She states tissue is falling more . She is able to empty bladder and no problems with BM . She is sexually active .    S/p TVH    Past Medical History:  has a past medical history of Hypertension.  Past Surgical History:  has a past surgical history that includes Hysterectomy (2013). Family History: family history includes No Known Problems in her father and mother. Social History:  reports that she has been smoking. She has never used smokeless tobacco. She reports current alcohol use. She reports that she does not use drugs. OB/GYN History:  OB History       Gravida  5   Para  5   Term  5   Preterm      AB      Living  4        SAB      IAB      Ectopic      Molar      Multiple      Live Births  4             Allergies: is allergic to sulfa (sulfonamide antibiotics). Medications:  Current Medications    Current Outpatient Medications:    benzonatate (TESSALON) 200 MG capsule, Take 1 capsule (200 mg total) by mouth 3 (three) times daily as needed for Cough (Patient not taking: Reported on 06/03/2021), Disp: 30 capsule, Rfl: 1   hydrocodone-chlorpheniramine (TUSSIONEX) 10-8 mg/5 mL ER suspension, Take 5 mLs by mouth every 12 (twelve) hours as needed for Cough (Patient not taking: Reported on 06/03/2021), Disp: 120 mL, Rfl: 0     Review of Systems: General:                      No fatigue or weight loss Eyes:                           No vision changes Ears:                            No hearing difficulty Respiratory:                No cough or shortness of breath Pulmonary:                  No asthma or shortness of breath Cardiovascular:           No chest pain, palpitations, dyspnea on exertion Gastrointestinal:          No abdominal bloating,  chronic diarrhea, constipations, masses, pain or hematochezia Genitourinary:             No hematuria, dysuria, abnormal vaginal discharge, pelvic pain, Menometrorrhagia, + falling vaginal tissue  Lymphatic:                   No swollen lymph nodes Musculoskeletal:No muscle weakness Neurologic:                  No extremity weakness, syncope, seizure disorder Psychiatric:                  No history of depression, delusions or suicidal/homicidal ideation  Exam:       Vitals:    11/17/22 1148  BP: 122/77  Pulse: 90      Body mass index is 22.4 kg/m.   WDWN white/ female in NAD   Lungs: CTA  CV : RRR without murmur     Neck:  no thyromegaly Abdomen: soft , no mass, normal active bowel sounds,  non-tender, no rebound tenderness Pelvic: tanner stage 5 ,  External genitalia: vulva /labia no lesions Urethra: no prolapse Vagina: normal physiologic d/c, grade 2 cystocele 1-2 rectocele ( rectum + stool bulging the tissue ) After discussion she is willing to try a pessary  Pessary placement #4 ring with sleeve    Impression:    The primary encounter diagnosis was Cystocele, midline. A diagnosis of Rectocele, female was also pertinent to this visit.       Plan:    Pessary fitting  and after walking , moving she thinks she wants to undergo surgery . Anterior and posterior colporrhaphy discussed with her . She is aware of the possible risks of dyspareunia due to narrowing of the vagina and possibility of urinary retention requiring bladder catheterization  ( so times long term )   She will review the information and description of the procedure and let me know if she wishes to proceed forward .     other risks of the procedure discussed - see [facility] notes          Cheryl Prader, MD

## 2022-11-25 ENCOUNTER — Encounter: Payer: Self-pay | Admitting: Family Medicine

## 2022-11-25 ENCOUNTER — Ambulatory Visit (INDEPENDENT_AMBULATORY_CARE_PROVIDER_SITE_OTHER): Payer: BC Managed Care – PPO | Admitting: Family Medicine

## 2022-11-25 ENCOUNTER — Encounter
Admission: RE | Admit: 2022-11-25 | Discharge: 2022-11-25 | Disposition: A | Discharge status: Home / Self Care | Payer: BC Managed Care – PPO | Source: Ambulatory Visit | Attending: Obstetrics and Gynecology | Admitting: Obstetrics and Gynecology

## 2022-11-25 VITALS — BP 112/74 | HR 69 | Temp 97.8°F | Ht 66.25 in | Wt 138.5 lb

## 2022-11-25 DIAGNOSIS — F429 Obsessive-compulsive disorder, unspecified: Secondary | ICD-10-CM | POA: Diagnosis not present

## 2022-11-25 DIAGNOSIS — Z23 Encounter for immunization: Secondary | ICD-10-CM

## 2022-11-25 DIAGNOSIS — Z8679 Personal history of other diseases of the circulatory system: Secondary | ICD-10-CM | POA: Insufficient documentation

## 2022-11-25 DIAGNOSIS — Z1231 Encounter for screening mammogram for malignant neoplasm of breast: Secondary | ICD-10-CM | POA: Insufficient documentation

## 2022-11-25 DIAGNOSIS — R110 Nausea: Secondary | ICD-10-CM | POA: Diagnosis not present

## 2022-11-25 DIAGNOSIS — G2581 Restless legs syndrome: Secondary | ICD-10-CM

## 2022-11-25 DIAGNOSIS — N814 Uterovaginal prolapse, unspecified: Secondary | ICD-10-CM

## 2022-11-25 HISTORY — DX: Obsessive-compulsive disorder, unspecified: F42.9

## 2022-11-25 HISTORY — DX: Restless legs syndrome: G25.81

## 2022-11-25 HISTORY — DX: Nausea: R11.0

## 2022-11-25 HISTORY — DX: Encounter for screening mammogram for malignant neoplasm of breast: Z12.31

## 2022-11-25 HISTORY — DX: Rectocele: N81.6

## 2022-11-25 HISTORY — DX: Cystocele, unspecified: N81.10

## 2022-11-25 NOTE — Progress Notes (Signed)
Subjective:    Patient ID: Cheryl Shah, female    DOB: 07/30/1970, 52 y.o.   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   One child in [place] ( daughter is recovered heroin  addict and teaching)  One at home 21  2 here    HTN is on her past med history list  Used to be on amlodipine  Then lost 38 lb  and no longer has blood pressure issues   RLS in past - has that  Used to take requip -did not work / no longer medicated   History of elevated ferritin in the past  She is unsure  No one told her she had a diagnosis   ER visit in April for n/v/d  That never got 100% better  Still some issues Less appetite  Still has nausea every day  - takes zofran as needed  May be food related/ but no sure  Diarrhea is on /off with some constipation  Sometimes no control  Has issues with movement of her intestine  No admissions but several ER visit   This all started with semaglutide (started in January) -then got sick in April    Last GI note  She reports she had a scheduled appointment with GI on 10/22/22." -cbc, cmp, lipase unremarkable -repeat ct noted cecum now in midline of lower pelvis w/o obstruction and moderate stool -pt told sxs likely due to Ozempic and d/c'd  She can eat a few bites of things Pancakes  Few bites of salad    Has colonoscopy scheduled 01/21/23  GI doctor is Dr Adolphus Birchwood with [facility]  Last mammogram : well over a year ago  Self breast exam : no lumps    Had a hysterectomy in 2017- for bleeding , she thinks she has one ovary  Last pap was about 5 y ago , no longer gets paps  G4P4  Does have menopause  symptoms   - hot flashes  Took black kohash  Improved   Fr Schermerhorn is her obgyn- she has a cystocele repair planned in july   Needs tetanus shot/last was 2012  Shingrix- not interested yet    Sleeps 5-6 h per night  3 people live in home   Family history  Mother had skin cancer and history of TIAs  Diabetes in a sister- type 2 Brother - obese with HTN       11/25/2022    8:33 AM 06/11/2019    3:46 PM 04/10/2019    1:52 PM  Depression screen PHQ 2/9  Decreased Interest 3 0 0  Down, Depressed, Hopeless 0 0 0  PHQ - 2 Score 3 0 0  Altered sleeping 3 0 1  Tired, decreased energy 3 2 1   Change in appetite 3 0 0  Feeling bad or failure about yourself  0 0 0  Trouble concentrating 0 0 0  Moving slowly or fidgety/restless 0 0 0  Suicidal thoughts 0 0 0  PHQ-9 Score 12 2 2   Difficult  doing work/chores Somewhat difficult  Not difficult at all      11/25/2022    8:34 AM  GAD 7 : Generalized Anxiety Score  Nervous, Anxious, on Edge 1  Control/stop worrying 0  Worry too much - different things 0  Trouble relaxing 1  Restless 1  Easily annoyed or irritable 0  Afraid - awful might happen 0  Total GAD 7 Score 3  Anxiety Difficulty Somewhat difficult   Had depression in her 76s - no longer feels depressed  Is tired  Anxiety is her major problem  Has OCD tendencies  Feels better than she used to be but likes things to be in order   (much worse in the past)  Does not feel like she needs counseling or medication     Some labs from chart  Lab Results  Component Value Date   NA 138 10/09/2022   K 4.1 10/09/2022   CO2 25 10/09/2022   GLUCOSE 93 10/09/2022   BUN 15 10/09/2022   CREATININE 0.75 10/09/2022   CALCIUM 9.6 10/09/2022   GFRNONAA >60 10/09/2022   Lab Results  Component Value Date   ALT 32 10/09/2022   AST 23 10/09/2022   ALKPHOS 116 10/09/2022   BILITOT 1.0 10/09/2022   Lab Results  Component Value Date   WBC 8.4 10/09/2022   HGB 14.8  10/09/2022   HCT 46.0 10/09/2022   MCV 93.1 10/09/2022   PLT 451 (H) 10/09/2022   Lab Results  Component Value Date   FERRITIN 346 (H) 06/11/2019       Patient Active Problem List   Diagnosis Date Noted   Cystocele with prolapse 11/26/2022   Chronic nausea 11/25/2022   H/O: hypertension 11/25/2022   RLS (restless legs syndrome) 11/25/2022   Encounter for screening mammogram for breast cancer 11/25/2022   OCD (obsessive compulsive disorder) 11/25/2022   Past Medical History:  Diagnosis Date   Chronic nausea    Cystocele with rectocele    Encounter for screening mammogram for breast cancer 11/25/2022   History of eating disorder    Hypertension    h/o lost 38 lbs and was able to come off Amlodipine due to bp control   OCD (obsessive compulsive disorder)    RLS (restless legs syndrome)    Past Surgical History:  Procedure Laterality Date   ABDOMINAL HYSTERECTOMY     TUBAL LIGATION     Social History   Tobacco Use   Smoking status: Former    Packs/day: 0.25    Years: 20.00    Additional pack years: 0.00    Total pack years: 5.00    Types: Cigarettes    Quit date: 01/06/2019    Years since quitting: 3.8   Smokeless tobacco: Never  Vaping Use   Vaping Use: Never used  Substance Use Topics   Alcohol use: Yes    Alcohol/week: 2.0 standard drinks of alcohol    Types: 2 Glasses of wine per week    Comment: occ   Drug use: Never   Family History  Problem Relation Age of Onset   Hypertension Mother    Cancer Mother    Heart Problems Mother    Heart Problems Father    Diabetes Sister    Allergies  Allergen Reactions   Sulfa Antibiotics Anaphylaxis   Bactrim [Sulfamethoxazole-Trimethoprim] Hives and Rash   Current Outpatient Medications on File Prior to Visit  Medication Sig Dispense Refill   ondansetron (ZOFRAN-ODT) 4 MG disintegrating tablet Take 1 tablet (  4 mg total) by mouth every 8 (eight) hours as needed for nausea or vomiting. 20 tablet 0   No  current facility-administered medications on file prior to visit.    Review of Systems  Constitutional:  Negative for activity change, appetite change, fatigue, fever and unexpected weight change.  HENT:  Negative for congestion, ear pain, rhinorrhea, sinus pressure and sore throat.   Eyes:  Negative for pain, redness and visual disturbance.  Respiratory:  Negative for cough, shortness of breath and wheezing.   Cardiovascular:  Negative for chest pain and palpitations.  Gastrointestinal:  Negative for abdominal pain, blood in stool, constipation and diarrhea.  Endocrine: Negative for polydipsia and polyuria.  Genitourinary:  Negative for dysuria, frequency and urgency.  Musculoskeletal:  Negative for arthralgias, back pain and myalgias.  Skin:  Negative for pallor and rash.  Allergic/Immunologic: Negative for environmental allergies.  Neurological:  Negative for dizziness, syncope and headaches.       Restless legs   Hematological:  Negative for adenopathy. Does not bruise/bleed easily.  Psychiatric/Behavioral:  Negative for decreased concentration and dysphoric mood. The patient is nervous/anxious.        Objective:   Physical Exam Constitutional:      General: She is not in acute distress.    Appearance: Normal appearance. She is well-developed and normal weight. She is not ill-appearing or diaphoretic.  HENT:     Head: Normocephalic and atraumatic.  Eyes:     General: No scleral icterus.    Conjunctiva/sclera: Conjunctivae normal.     Pupils: Pupils are equal, round, and reactive to light.  Neck:     Thyroid: No thyromegaly.     Vascular: No carotid bruit or JVD.  Cardiovascular:     Rate and Rhythm: Normal rate and regular rhythm.     Heart sounds: Normal heart sounds.     No gallop.  Pulmonary:     Effort: Pulmonary effort is normal. No respiratory distress.     Breath sounds: Normal breath sounds. No wheezing or rales.  Abdominal:     General: There is no distension  or abdominal bruit.     Palpations: Abdomen is soft.  Musculoskeletal:     Cervical back: Normal range of motion and neck supple.     Right lower leg: No edema.     Left lower leg: No edema.  Lymphadenopathy:     Cervical: No cervical adenopathy.  Skin:    General: Skin is warm and dry.     Coloration: Skin is not pale.     Findings: No rash.  Neurological:     Mental Status: She is alert.     Coordination: Coordination normal.     Deep Tendon Reflexes: Reflexes are normal and symmetric. Reflexes normal.  Psychiatric:        Mood and Affect: Mood normal.           Assessment & Plan:   Problem List Items Addressed This Visit       Genitourinary   Cystocele with prolapse    Planning repair next month with gyn Dr Feliberto Gottron          Other   RLS (restless legs syndrome)    Used to take requip Off of it now  Thinks she is ok without treatment       OCD (obsessive compulsive disorder)    Pt declines pharm treatment or counseling-has dealt with this for a long time and has good coping skills PHQ of 12  and GAD7 of 3       H/O: hypertension    This is improved with 38 lb weight loss Past treatment with amlodipine-now off of it  BP: 112/74        Encounter for screening mammogram for breast cancer    Mammogram ordered Pt will call to schedule       Relevant Orders   MM 3D SCREENING MAMMOGRAM BILATERAL BREAST   Chronic nausea - Primary    This began with a course of ozempic for weight loss and symptoms never improved completely after stopping  Lost 38 lb Sees GI/ records reviewed  Daily nausea and reduced appetite also some diarrhea on/off  Multiple ER visits  There was abn with position of cecum on scan/ that improved  Colonoscopy planned GI provider is Dr Raynald Blend with [facility]        Other Visit Diagnoses     Need for Td vaccine       Relevant Orders   Td : Tetanus/diphtheria >7yo Preservative  free (Completed)

## 2022-11-25 NOTE — Patient Instructions (Addendum)
Tetanus shot today   Td   Keep taking care of yourself ! Continue seeing GI for your GI issues      You have an order for:  []   2D Mammogram  [x]   3D Mammogram  []   Bone Density     Please call for appointment:   [x]   [facility]  [address] [place] [address]  [phone]  []   [facility])   [address]  [place], [place] [address]  [phone]  []   [facility]      [address] [place], [place]        [phone]         []   [facility]  [address] [place], [place]  [phone]  []  [facility]   [address]   [place], [place] [address]  [phone]  []  [facility]  [address] [place], [place] [address] [phone]    Make sure to wear two piece clothing  No lotions powders or deodorants the day of the appointment Make sure to bring picture ID and insurance card.  Bring list of medications you are currently taking including any supplements.   Schedule your screening mammogram through MyChart!   Select [place] imaging sites can now be scheduled through MyChart.  Log into your MyChart account.  Go to 'Visit' (or 'Appointments' if  on mobile App) --> Schedule an  Appointment  Under 'Select a Reason for Visit' choose the Mammogram  Screening option.  Complete the pre-visit questions  and select the time and place that  best fits your schedule

## 2022-11-25 NOTE — Patient Instructions (Addendum)
Your procedure is scheduled on:12-03-22 Friday Report to the Registration Desk on the 1st floor of the Medical Mall.Then proceed to the 2nd floor Surgery Desk To find out your arrival time, please call [phone] between 1PM - 3PM on:12-01-22 Wednesday If your arrival time is 6:00 am, do not arrive before that time as the Medical Mall entrance doors do not open until 6:00 am.  REMEMBER: Instructions that are not followed completely may result in serious medical risk, up to and including death; or upon the discretion of your surgeon and anesthesiologist your surgery may need to be rescheduled.  Do not eat food after midnight the night before surgery.  No gum chewing or hard candies.  You may however, drink CLEAR liquids up to 2 hours before you are scheduled to arrive for your surgery. Do not drink anything within 2 hours of your scheduled arrival time.  Clear liquids include: - water  - apple juice without pulp - gatorade (not RED colors) - black coffee or tea (Do NOT add milk or creamers to the coffee or tea) Do NOT drink anything that is not on this list.  In addition, your doctor has ordered for you to drink the provided:  Ensure Pre-Surgery Clear Carbohydrate Drink  Drinking this carbohydrate drink up to two hours before surgery helps to reduce insulin resistance and improve patient outcomes. Please complete drinking 2 hours before scheduled arrival time.  One week prior to surgery: Stop Anti-inflammatories (NSAIDS) such as Advil, Aleve, Ibuprofen, Motrin, Naproxen, Naprosyn and Aspirin based products such as Excedrin, Goody's Powder, BC Powder.You may however, take Tylenol if needed for pain up until the day of surgery. Stop ANY OVER THE COUNTER supplements/vitamins NOW (11-25-22) until after surgery.  TAKE ONLY THESE MEDICATIONS THE MORNING OF SURGERY WITH A SIP OF WATER: -You may take ondansetron [facility]) if needed for nausea  No Alcohol for 24 hours before or after  surgery.  No Smoking including e-cigarettes for 24 hours before surgery.  No chewable tobacco products for at least 6 hours before surgery.  No nicotine patches on the day of surgery.  Do not use any "recreational" drugs for at least a week (preferably 2 weeks) before your surgery.  Please be advised that the combination of cocaine and anesthesia may have negative outcomes, up to and including death. If you test positive for cocaine, your surgery will be cancelled.  On the morning of surgery brush your teeth with toothpaste and water, you may rinse your mouth with mouthwash if you wish. Do not swallow any toothpaste or mouthwash.  Use CHG Soap as directed on instruction sheet.  Do not wear jewelry, make-up, hairpins, clips or nail polish.  Do not wear lotions, powders, or perfumes.   Do not shave body hair from the neck down 48 hours before surgery.  Contact lenses, hearing aids and dentures may not be worn into surgery.  Do not bring valuables to the hospital. [facility] is not responsible for any missing/lost belongings or valuables.   Notify your doctor if there is any change in your medical condition (cold, fever, infection).  Wear comfortable clothing (specific to your surgery type) to the hospital.  After surgery, you can help prevent lung complications by doing breathing exercises.  Take deep breaths and cough every 1-2 hours. Your doctor may order a device called an Incentive Spirometer to help you take deep breaths. When coughing or sneezing, hold a pillow firmly against your incision with both hands. This is called "splinting."  Doing this helps protect your incision. It also decreases belly discomfort.  If you are being admitted to the hospital overnight, leave your suitcase in the car. After surgery it may be brought to your room.  In case of increased patient census, it may be necessary for you, the patient, to continue your postoperative care in the Same Day Surgery  department.  If you are being discharged the day of surgery, you will not be allowed to drive home. You will need a responsible individual to drive you home and stay with you for 24 hours after surgery.   If you are taking public transportation, you will need to have a responsible individual with you.  Please call the Pre-admissions Testing Dept. at [phone] if you have any questions about these instructions.  Surgery Visitation Policy:  Patients having surgery or a procedure may have two visitors.  Children under the age of 94 must have an adult with them who is not the patient.   How to Use an Incentive Spirometer An incentive spirometer is a tool that measures how well you are filling your lungs with each breath. Learning to take long, deep breaths using this tool can help you keep your lungs clear and active. This may help to reverse or lessen your chance of developing breathing (pulmonary) problems, especially infection. You may be asked to use a spirometer: After a surgery. If you have a lung problem or a history of smoking. After a long period of time when you have been unable to move or be active. If the spirometer includes an indicator to show the highest number that you have reached, your health care provider or respiratory therapist will help you set a goal. Keep a log of your progress as told by your health care provider. What are the risks? Breathing too quickly may cause dizziness or cause you to pass out. Take your time so you do not get dizzy or light-headed. If you are in pain, you may need to take pain medicine before doing incentive spirometry. It is harder to take a deep breath if you are having pain. How to use your incentive spirometer  Sit up on the edge of your bed or on a chair. Hold the incentive spirometer so that it is in an upright position. Before you use the spirometer, breathe out normally. Place the mouthpiece in your mouth. Make sure your lips are  closed tightly around it. Breathe in slowly and as deeply as you can through your mouth, causing the piston or the ball to rise toward the top of the chamber. Hold your breath for 3-5 seconds, or for as long as possible. If the spirometer includes a coach indicator, use this to guide you in breathing. Slow down your breathing if the indicator goes above the marked areas. Remove the mouthpiece from your mouth and breathe out normally. The piston or ball will return to the bottom of the chamber. Rest for a few seconds, then repeat the steps 10 or more times. Take your time and take a few normal breaths between deep breaths so that you do not get dizzy or light-headed. Do this every 1-2 hours when you are awake. If the spirometer includes a goal marker to show the highest number you have reached (best effort), use this as a goal to work toward during each repetition. After each set of 10 deep breaths, cough a few times. This will help to make sure that your lungs are clear. If you  have an incision on your chest or abdomen from surgery, place a pillow or a rolled-up towel firmly against the incision when you cough. This can help to reduce pain while taking deep breaths and coughing. General tips When you are able to get out of bed: Walk around often. Continue to take deep breaths and cough in order to clear your lungs. Keep using the incentive spirometer until your health care provider says it is okay to stop using it. If you have been in the hospital, you may be told to keep using the spirometer at home. Contact a health care provider if: You are having difficulty using the spirometer. You have trouble using the spirometer as often as instructed. Your pain medicine is not giving enough relief for you to use the spirometer as told. You have a fever. Get help right away if: You develop shortness of breath. You develop a cough with bloody mucus from the lungs. You have fluid or blood coming from an  incision site after you cough. Summary An incentive spirometer is a tool that can help you learn to take long, deep breaths to keep your lungs clear and active. You may be asked to use a spirometer after a surgery, if you have a lung problem or a history of smoking, or if you have been inactive for a long period of time. Use your incentive spirometer as instructed every 1-2 hours while you are awake. If you have an incision on your chest or abdomen, place a pillow or a rolled-up towel firmly against your incision when you cough. This will help to reduce pain. Get help right away if you have shortness of breath, you cough up bloody mucus, or blood comes from your incision when you cough. This information is not intended to replace advice given to you by your health care provider. Make sure you discuss any questions you have with your health care provider. Document Revised: 08/06/2019 Document Reviewed: 08/06/2019 Elsevier Patient Education  2024 [employer].

## 2022-11-26 ENCOUNTER — Encounter: Payer: Self-pay | Admitting: Family Medicine

## 2022-11-26 DIAGNOSIS — N814 Uterovaginal prolapse, unspecified: Secondary | ICD-10-CM | POA: Insufficient documentation

## 2022-11-26 NOTE — Assessment & Plan Note (Signed)
This began with a course of ozempic for weight loss and symptoms never improved completely after stopping  Lost 38 lb Sees GI/ records reviewed  Daily nausea and reduced appetite also some diarrhea on/off  Multiple ER visits  There was abn with position of cecum on scan/ that improved  Colonoscopy planned GI provider is Dr Raynald Blend with [facility]

## 2022-11-26 NOTE — Assessment & Plan Note (Signed)
Planning repair next month with gyn Dr Feliberto Gottron

## 2022-11-26 NOTE — Assessment & Plan Note (Signed)
This is improved with 38 lb weight loss Past treatment with amlodipine-now off of it  BP: 112/74

## 2022-11-26 NOTE — Assessment & Plan Note (Signed)
Used to take requip Off of it now  Thinks she is ok without treatment

## 2022-11-26 NOTE — Assessment & Plan Note (Signed)
Mammogram ordered °Pt will call to schedule  °

## 2022-11-26 NOTE — Assessment & Plan Note (Signed)
Pt declines pharm treatment or counseling-has dealt with this for a long time and has good coping skills PHQ of 12 and GAD7 of 3

## 2022-12-01 ENCOUNTER — Encounter
Admission: RE | Admit: 2022-12-01 | Discharge: 2022-12-01 | Disposition: A | Discharge status: Home / Self Care | Payer: BC Managed Care – PPO | Source: Ambulatory Visit | Attending: Obstetrics and Gynecology | Admitting: Obstetrics and Gynecology

## 2022-12-01 DIAGNOSIS — Z01818 Encounter for other preprocedural examination: Secondary | ICD-10-CM

## 2022-12-01 DIAGNOSIS — N816 Rectocele: Secondary | ICD-10-CM | POA: Diagnosis not present

## 2022-12-01 DIAGNOSIS — G2581 Restless legs syndrome: Secondary | ICD-10-CM | POA: Diagnosis not present

## 2022-12-01 DIAGNOSIS — I10 Essential (primary) hypertension: Secondary | ICD-10-CM | POA: Diagnosis not present

## 2022-12-01 DIAGNOSIS — F17200 Nicotine dependence, unspecified, uncomplicated: Secondary | ICD-10-CM | POA: Diagnosis not present

## 2022-12-01 DIAGNOSIS — Z01812 Encounter for preprocedural laboratory examination: Secondary | ICD-10-CM | POA: Insufficient documentation

## 2022-12-01 DIAGNOSIS — N8111 Cystocele, midline: Secondary | ICD-10-CM | POA: Diagnosis not present

## 2022-12-01 LAB — CBC
HCT: 42.6 % (ref 36.0–46.0)
Hemoglobin: 13.8 g/dL (ref 12.0–15.0)
MCH: 30.4 pg (ref 26.0–34.0)
MCHC: 32.4 g/dL (ref 30.0–36.0)
MCV: 93.8 fL (ref 80.0–100.0)
Platelets: 445 10*3/uL — ABNORMAL HIGH (ref 150–400)
RBC: 4.54 MIL/uL (ref 3.87–5.11)
RDW: 13.1 % (ref 11.5–15.5)
WBC: 5.6 10*3/uL (ref 4.0–10.5)
nRBC: 0 % (ref 0.0–0.2)

## 2022-12-01 LAB — BASIC METABOLIC PANEL
Anion gap: 8 (ref 5–15)
BUN: 13 mg/dL (ref 6–20)
CO2: 28 mmol/L (ref 22–32)
Calcium: 9.7 mg/dL (ref 8.9–10.3)
Chloride: 104 mmol/L (ref 98–111)
Creatinine, Ser: 0.61 mg/dL (ref 0.44–1.00)
GFR, Estimated: >60 mL/min (ref >60)
Glucose, Bld: 73 mg/dL (ref 70–99)
Potassium: 3.8 mmol/L (ref 3.5–5.1)
Sodium: 140 mmol/L (ref 135–145)

## 2022-12-01 LAB — TYPE AND SCREEN
ABO/RH(D): O POS
Antibody Screen: NEGATIVE

## 2022-12-02 MED ORDER — ORAL CARE MOUTH RINSE: 15.0000 mL | Freq: Once | OROMUCOSAL | Status: AC

## 2022-12-02 MED ORDER — CEFAZOLIN SODIUM-DEXTROSE 2-4 GM/100ML-% IV SOLN
2.0000 g | Freq: Once | INTRAVENOUS | Status: AC
  Administered 2022-12-03: 2 g via INTRAVENOUS

## 2022-12-02 MED ORDER — CHLORHEXIDINE GLUCONATE 0.12 % MT SOLN
15.0000 mL | Freq: Once | OROMUCOSAL | Status: AC
  Administered 2022-12-03: 15 mL via OROMUCOSAL

## 2022-12-02 MED ORDER — LACTATED RINGERS IV SOLN: INTRAVENOUS | Status: DC

## 2022-12-02 MED ORDER — ACETAMINOPHEN 500 MG PO TABS
1000.0000 mg | ORAL_TABLET | ORAL | Status: AC
  Administered 2022-12-03: 1000 mg via ORAL

## 2022-12-02 MED ORDER — FAMOTIDINE 20 MG PO TABS
20.0000 mg | ORAL_TABLET | Freq: Once | ORAL | Status: AC
  Administered 2022-12-03: 20 mg via ORAL

## 2022-12-02 MED ORDER — GABAPENTIN 300 MG PO CAPS
300.0000 mg | ORAL_CAPSULE | ORAL | Status: AC
  Administered 2022-12-03: 300 mg via ORAL

## 2022-12-02 MED ORDER — POVIDONE-IODINE 10 % EX SWAB: 2.0000 | Freq: Once | CUTANEOUS | Status: DC

## 2022-12-03 ENCOUNTER — Encounter: Payer: Self-pay | Admitting: Obstetrics and Gynecology

## 2022-12-03 ENCOUNTER — Ambulatory Visit: Payer: BC Managed Care – PPO | Admitting: Anesthesiology

## 2022-12-03 ENCOUNTER — Other Ambulatory Visit: Payer: Self-pay

## 2022-12-03 ENCOUNTER — Encounter
Admission: RE | Disposition: A | Discharge status: Home / Self Care | Payer: Self-pay | Source: Home / Self Care | Attending: Obstetrics and Gynecology

## 2022-12-03 ENCOUNTER — Ambulatory Visit
Admission: RE | Admit: 2022-12-03 | Discharge: 2022-12-03 | Disposition: A | Discharge status: Home / Self Care | Payer: BC Managed Care – PPO | Attending: Obstetrics and Gynecology | Admitting: Obstetrics and Gynecology

## 2022-12-03 ENCOUNTER — Ambulatory Visit: Payer: BC Managed Care – PPO | Admitting: Urgent Care

## 2022-12-03 DIAGNOSIS — N8111 Cystocele, midline: Secondary | ICD-10-CM | POA: Diagnosis not present

## 2022-12-03 DIAGNOSIS — F17200 Nicotine dependence, unspecified, uncomplicated: Secondary | ICD-10-CM | POA: Diagnosis not present

## 2022-12-03 DIAGNOSIS — I10 Essential (primary) hypertension: Secondary | ICD-10-CM | POA: Insufficient documentation

## 2022-12-03 DIAGNOSIS — G2581 Restless legs syndrome: Secondary | ICD-10-CM | POA: Diagnosis not present

## 2022-12-03 DIAGNOSIS — N816 Rectocele: Secondary | ICD-10-CM | POA: Insufficient documentation

## 2022-12-03 DIAGNOSIS — Z01818 Encounter for other preprocedural examination: Secondary | ICD-10-CM

## 2022-12-03 DIAGNOSIS — N8110 Cystocele, unspecified: Secondary | ICD-10-CM | POA: Diagnosis not present

## 2022-12-03 HISTORY — PX: ANTERIOR AND POSTERIOR REPAIR: SHX5121

## 2022-12-03 LAB — ABO/RH: ABO/RH(D): O POS

## 2022-12-03 SURGERY — ANTERIOR (CYSTOCELE) AND POSTERIOR REPAIR (RECTOCELE)
Anesthesia: General | Site: Vagina

## 2022-12-03 MED ORDER — DEXAMETHASONE SODIUM PHOSPHATE 10 MG/ML IJ SOLN
INTRAMUSCULAR | Status: AC
  Filled 2022-12-03: qty 1

## 2022-12-03 MED ORDER — FENTANYL CITRATE (PF) 100 MCG/2ML IJ SOLN
INTRAMUSCULAR | Status: DC | PRN
  Administered 2022-12-03: 50 ug via INTRAVENOUS

## 2022-12-03 MED ORDER — SUCCINYLCHOLINE CHLORIDE 200 MG/10ML IV SOSY
PREFILLED_SYRINGE | INTRAVENOUS | Status: DC | PRN
  Administered 2022-12-03: 100 mg via INTRAVENOUS

## 2022-12-03 MED ORDER — ROCURONIUM BROMIDE 100 MG/10ML IV SOLN
INTRAVENOUS | Status: DC | PRN
  Administered 2022-12-03 (×2): 20 mg via INTRAVENOUS
  Administered 2022-12-03: 30 mg via INTRAVENOUS

## 2022-12-03 MED ORDER — SUGAMMADEX SODIUM 200 MG/2ML IV SOLN
INTRAVENOUS | Status: DC | PRN
  Administered 2022-12-03: 200 mg via INTRAVENOUS

## 2022-12-03 MED ORDER — PROPOFOL 1000 MG/100ML IV EMUL
INTRAVENOUS | Status: AC
  Filled 2022-12-03: qty 100

## 2022-12-03 MED ORDER — LIDOCAINE-EPINEPHRINE 1 %-1:100000 IJ SOLN
INTRAMUSCULAR | Status: AC
  Filled 2022-12-03: qty 1

## 2022-12-03 MED ORDER — ROCURONIUM BROMIDE 10 MG/ML (PF) SYRINGE
PREFILLED_SYRINGE | INTRAVENOUS | Status: AC
  Filled 2022-12-03: qty 10

## 2022-12-03 MED ORDER — OXYCODONE HCL 5 MG PO TABS: 5.0000 mg | ORAL_TABLET | ORAL | Status: DC | PRN

## 2022-12-03 MED ORDER — ACETAMINOPHEN 10 MG/ML IV SOLN: 1000.0000 mg | Freq: Once | INTRAVENOUS | Status: DC | PRN

## 2022-12-03 MED ORDER — KETOROLAC TROMETHAMINE 30 MG/ML IJ SOLN
INTRAMUSCULAR | Status: DC | PRN
  Administered 2022-12-03: 30 mg via INTRAVENOUS

## 2022-12-03 MED ORDER — PROPOFOL 10 MG/ML IV BOLUS
INTRAVENOUS | Status: DC | PRN
  Administered 2022-12-03: 200 ug/kg/min via INTRAVENOUS
  Administered 2022-12-03: 150 mg via INTRAVENOUS
  Administered 2022-12-03: 50 mg via INTRAVENOUS

## 2022-12-03 MED ORDER — MIDAZOLAM HCL 2 MG/2ML IJ SOLN
INTRAMUSCULAR | Status: DC | PRN
  Administered 2022-12-03: 2 mg via INTRAVENOUS

## 2022-12-03 MED ORDER — DEXAMETHASONE SODIUM PHOSPHATE 10 MG/ML IJ SOLN
INTRAMUSCULAR | Status: DC | PRN
  Administered 2022-12-03: 10 mg via INTRAVENOUS

## 2022-12-03 MED ORDER — GABAPENTIN 300 MG PO CAPS
ORAL_CAPSULE | ORAL | Status: AC
  Filled 2022-12-03: qty 1

## 2022-12-03 MED ORDER — CEFAZOLIN SODIUM-DEXTROSE 2-4 GM/100ML-% IV SOLN
INTRAVENOUS | Status: AC
  Filled 2022-12-03: qty 100

## 2022-12-03 MED ORDER — LIDOCAINE HCL (CARDIAC) PF 100 MG/5ML IV SOSY
PREFILLED_SYRINGE | INTRAVENOUS | Status: DC | PRN
  Administered 2022-12-03: 100 mg via INTRAVENOUS

## 2022-12-03 MED ORDER — ACETAMINOPHEN 10 MG/ML IV SOLN
INTRAVENOUS | Status: AC
  Filled 2022-12-03: qty 100

## 2022-12-03 MED ORDER — OXYCODONE HCL 5 MG PO TABS
ORAL_TABLET | ORAL | Status: AC
  Filled 2022-12-03: qty 1

## 2022-12-03 MED ORDER — ONDANSETRON 4 MG PO TBDP: 4.0000 mg | ORAL_TABLET | Freq: Four times a day (QID) | ORAL | Status: DC | PRN

## 2022-12-03 MED ORDER — ONDANSETRON HCL 4 MG/2ML IJ SOLN
4.0000 mg | Freq: Once | INTRAMUSCULAR | Status: AC | PRN
  Administered 2022-12-03: 4 mg via INTRAVENOUS

## 2022-12-03 MED ORDER — LIDOCAINE-EPINEPHRINE 1 %-1:100000 IJ SOLN
INTRAMUSCULAR | Status: DC | PRN
  Administered 2022-12-03: 13 mL

## 2022-12-03 MED ORDER — OXYCODONE HCL 5 MG PO TABS
5.0000 mg | ORAL_TABLET | Freq: Once | ORAL | Status: AC | PRN
  Administered 2022-12-03: 5 mg via ORAL

## 2022-12-03 MED ORDER — FENTANYL CITRATE (PF) 100 MCG/2ML IJ SOLN
INTRAMUSCULAR | Status: AC
  Filled 2022-12-03: qty 2

## 2022-12-03 MED ORDER — ONDANSETRON HCL 4 MG/2ML IJ SOLN
INTRAMUSCULAR | Status: AC
  Filled 2022-12-03: qty 2

## 2022-12-03 MED ORDER — SILVER NITRATE-POT NITRATE 75-25 % EX MISC
CUTANEOUS | Status: AC
  Filled 2022-12-03: qty 10

## 2022-12-03 MED ORDER — ONDANSETRON HCL 4 MG/2ML IJ SOLN
INTRAMUSCULAR | Status: DC | PRN
  Administered 2022-12-03: 4 mg via INTRAVENOUS

## 2022-12-03 MED ORDER — FENTANYL CITRATE (PF) 100 MCG/2ML IJ SOLN
25.0000 ug | INTRAMUSCULAR | Status: DC | PRN
  Administered 2022-12-03 (×3): 25 ug via INTRAVENOUS

## 2022-12-03 MED ORDER — OXYCODONE HCL 5 MG/5ML PO SOLN: 5.0000 mg | Freq: Once | ORAL | Status: AC | PRN

## 2022-12-03 MED ORDER — MIDAZOLAM HCL 2 MG/2ML IJ SOLN
INTRAMUSCULAR | Status: AC
  Filled 2022-12-03: qty 2

## 2022-12-03 MED ORDER — ESTROGENS CONJUGATED 0.625 MG/GM VA CREA
TOPICAL_CREAM | VAGINAL | Status: AC
  Filled 2022-12-03: qty 30

## 2022-12-03 MED ORDER — 0.9 % SODIUM CHLORIDE (POUR BTL) OPTIME
TOPICAL | Status: DC | PRN
  Administered 2022-12-03: 500 mL

## 2022-12-03 MED ORDER — ACETAMINOPHEN 500 MG PO TABS
ORAL_TABLET | ORAL | Status: AC
  Filled 2022-12-03: qty 2

## 2022-12-03 MED ORDER — CHLORHEXIDINE GLUCONATE 0.12 % MT SOLN
OROMUCOSAL | Status: AC
  Filled 2022-12-03: qty 15

## 2022-12-03 MED ORDER — LIDOCAINE HCL (PF) 2 % IJ SOLN
INTRAMUSCULAR | Status: AC
  Filled 2022-12-03: qty 5

## 2022-12-03 MED ORDER — FAMOTIDINE 20 MG PO TABS
ORAL_TABLET | ORAL | Status: AC
  Filled 2022-12-03: qty 1

## 2022-12-03 SURGICAL SUPPLY — 41 items
BAG DRN RND TRDRP ANRFLXCHMBR (UROLOGICAL SUPPLIES) ×1
BAG URINE DRAIN 2000ML AR STRL (UROLOGICAL SUPPLIES) ×1 IMPLANT
BLADE SURG 15 STRL LF DISP TIS (BLADE) ×1 IMPLANT
BLADE SURG 15 STRL SS (BLADE) ×1
CATH FOLEY 2WAY 5CC 16FR (CATHETERS) ×1
CATH ROBINSON RED A/P 16FR (CATHETERS) ×1 IMPLANT
CATH URTH 16FR FL 2W BLN LF (CATHETERS) ×1 IMPLANT
DRAPE PERI LITHO V/GYN (MISCELLANEOUS) ×1 IMPLANT
DRAPE SURG 17X11 SM STRL (DRAPES) ×1 IMPLANT
DRAPE UNDER BUTTOCK W/FLU (DRAPES) ×1 IMPLANT
ELECT REM PT RETURN 9FT ADLT (ELECTROSURGICAL) ×1
ELECTRODE REM PT RTRN 9FT ADLT (ELECTROSURGICAL) ×1 IMPLANT
GAUZE 4X4 16PLY ~~LOC~~+RFID DBL (SPONGE) ×1 IMPLANT
GAUZE PACK 2X3YD (PACKING) ×1 IMPLANT
GLOVE SURG SYN 8.0 (GLOVE) ×1 IMPLANT
GLOVE SURG SYN 8.0 PF PI (GLOVE) ×1 IMPLANT
GOWN STRL REUS W/ TWL LRG LVL3 (GOWN DISPOSABLE) ×3 IMPLANT
GOWN STRL REUS W/ TWL XL LVL3 (GOWN DISPOSABLE) ×1 IMPLANT
GOWN STRL REUS W/TWL LRG LVL3 (GOWN DISPOSABLE) ×3
GOWN STRL REUS W/TWL XL LVL3 (GOWN DISPOSABLE) ×1
KIT TURNOVER CYSTO (KITS) ×1 IMPLANT
LABEL OR SOLS (LABEL) ×1 IMPLANT
MANIFOLD NEPTUNE II (INSTRUMENTS) ×1 IMPLANT
NDL HYPO 22X1.5 SAFETY MO (MISCELLANEOUS) ×1 IMPLANT
NEEDLE HYPO 22X1.5 SAFETY MO (MISCELLANEOUS) ×1 IMPLANT
NS IRRIG 500ML POUR BTL (IV SOLUTION) ×1 IMPLANT
PACK BASIN MINOR ARMC (MISCELLANEOUS) ×1 IMPLANT
PAD OB MATERNITY 4.3X12.25 (PERSONAL CARE ITEMS) ×1 IMPLANT
PAD PREP OB/GYN DISP 24X41 (PERSONAL CARE ITEMS) ×1 IMPLANT
SCRUB CHG 4% DYNA-HEX 4OZ (MISCELLANEOUS) ×1 IMPLANT
SUT PDS 2-0 27IN (SUTURE) ×1 IMPLANT
SUT VIC AB 0 CT1 36 (SUTURE) ×1 IMPLANT
SUT VIC AB 2-0 CT1 36 (SUTURE) IMPLANT
SUT VIC AB 2-0 SH 27 (SUTURE) ×5
SUT VIC AB 2-0 SH 27XBRD (SUTURE) ×3 IMPLANT
SUT VIC AB 3-0 SH 27 (SUTURE) ×3
SUT VIC AB 3-0 SH 27X BRD (SUTURE) IMPLANT
SYR 10ML LL (SYRINGE) ×1 IMPLANT
SYR CONTROL 10ML LL (SYRINGE) IMPLANT
TRAP FLUID SMOKE EVACUATOR (MISCELLANEOUS) ×1 IMPLANT
WATER STERILE IRR 500ML POUR (IV SOLUTION) ×1 IMPLANT

## 2022-12-03 NOTE — Discharge Instructions (Addendum)
AMBULATORY SURGERY  DISCHARGE INSTRUCTIONS   The drugs that you were given will stay in your system until tomorrow so for the next 24 hours you should not:  Drive an automobile Make any legal decisions Drink any alcoholic beverage   You may resume regular meals tomorrow.  Today it is better to start with liquids and gradually work up to solid foods.  You may eat anything you prefer, but it is better to start with liquids, then soup and crackers, and gradually work up to solid foods.   Please notify your doctor immediately if you have any unusual bleeding, trouble breathing, redness and pain at the surgery site, drainage, fever, or pain not relieved by medication.    Additional Instructions:   Toradol (IV NSAID) last given at 9:38 am, Ibuprofen will replace this mediation given at hospital Gabapentin 300 mg given at 6:34 am Tylenol 1,000 mg given at 6:34 am Oxycodone 5 mg at 10:47 am   Please contact your physician with any problems or Same Day Surgery at [phone], Monday through Friday 6 am to 4 pm, or [place] [facility] number at [phone].

## 2022-12-03 NOTE — Anesthesia Preprocedure Evaluation (Signed)
Anesthesia Evaluation  Patient identified by MRN, date of birth, ID band Patient awake    Reviewed: Allergy & Precautions, NPO status , Patient's Chart, lab work & pertinent test results  History of Anesthesia Complications Negative for: history of anesthetic complications  Airway Mallampati: I  TM Distance: >3 FB Neck ROM: Full    Dental no notable dental hx. (+) Teeth Intact   Pulmonary neg pulmonary ROS, neg sleep apnea, neg COPD, Patient abstained from smoking.Not current smoker, former smoker   Pulmonary exam normal breath sounds clear to auscultation       Cardiovascular Exercise Tolerance: Good METS(-) hypertension(-) CAD and (-) Past MI negative cardio ROS (-) dysrhythmias  Rhythm:Regular Rate:Normal - Systolic murmurs    Neuro/Psych  PSYCHIATRIC DISORDERS      negative neurological ROS     GI/Hepatic ,neg GERD  ,,(+)     (-) substance abuse  Chronic nausea   Endo/Other  neg diabetes    Renal/GU negative Renal ROS     Musculoskeletal   Abdominal   Peds  Hematology   Anesthesia Other Findings Past Medical History: No date: Chronic nausea No date: Cystocele with rectocele 11/25/2022: Encounter for screening mammogram for breast cancer No date: History of eating disorder No date: Hypertension     Comment:  h/o lost 38 lbs and was able to come off Amlodipine due               to bp control No date: OCD (obsessive compulsive disorder) No date: RLS (restless legs syndrome)  Reproductive/Obstetrics                             Anesthesia Physical Anesthesia Plan  ASA: 2  Anesthesia Plan: General   Post-op Pain Management: Tylenol PO (pre-op)* and Gabapentin PO (pre-op)*   Induction: Intravenous  PONV Risk Score and Plan: 4 or greater and Ondansetron, Dexamethasone, TIVA, Propofol infusion and Midazolam  Airway Management Planned: LMA  Additional Equipment:  None  Intra-op Plan:   Post-operative Plan: Extubation in OR  Informed Consent: I have reviewed the patients History and Physical, chart, labs and discussed the procedure including the risks, benefits and alternatives for the proposed anesthesia with the patient or authorized representative who has indicated his/her understanding and acceptance.     Dental advisory given  Plan Discussed with: CRNA and Surgeon  Anesthesia Plan Comments: (Discussed risks of anesthesia with patient, including PONV, sore throat, lip/dental/eye damage. Rare risks discussed as well, such as cardiorespiratory and neurological sequelae, and allergic reactions. Discussed the role of CRNA in patient's perioperative care. Patient understands.)       Anesthesia Quick Evaluation

## 2022-12-03 NOTE — Anesthesia Procedure Notes (Addendum)
Procedure Name: Intubation Date/Time: 12/03/2022 7:47 AM  Performed by: Lysbeth Penner, CRNAPre-anesthesia Checklist: Patient identified, Emergency Drugs available, Suction available and Patient being monitored Patient Re-evaluated:Patient Re-evaluated prior to induction Oxygen Delivery Method: Circle system utilized Preoxygenation: Pre-oxygenation with 100% oxygen Induction Type: IV induction Ventilation: Mask ventilation without difficulty Laryngoscope Size: McGraph and 3 Grade View: Grade I Tube type: Oral Tube size: 6.5 mm Number of attempts: 1 Airway Equipment and Method: Stylet and Oral airway Placement Confirmation: ETT inserted through vocal cords under direct vision, positive ETCO2 and breath sounds checked- equal and bilateral Secured at: 20 cm Tube secured with: Tape Dental Injury: Teeth and Oropharynx as per pre-operative assessment

## 2022-12-03 NOTE — Op Note (Signed)
NAMERHEN, Cheryl Shah: [id number] ACCOUNT Shah: [id number] DATE OF BIRTH: [birth year] FACILITY: [facility] LOCATION: [facility] PHYSICIAN: Suzy Bouchard, MD  Operative Report   DATE OF PROCEDURE: 12/03/2022  PREOPERATIVE DIAGNOSES: 1.  Cystocele. 2.  Rectocele.  POSTOPERATIVE DIAGNOSES: 1.  Cystocele. 2.  Rectocele.  PROCEDURE: 1.  Anterior and posterior colporrhaphy. 2.  Perineorrhaphy.  ANESTHESIA:  General endotracheal anesthesia.  SURGEON:  Suzy Bouchard, MD  FIRST ASSISTANT:  Christeen Douglas, MD  INDICATIONS:  A 52 year old gravida 5, para 5, patient with a grade 2-3 cystocele and a grade 1-2 rectocele noted on examination.  The patient has failed conservative treatment with pessary and wishes to have definitive treatment.  DESCRIPTION OF PROCEDURE:  After adequate general endotracheal anesthesia, the patient was placed in dorsal supine position with the legs in the candy cane stirrups.  The patient's lower abdomen, perineum and vagina were prepped and draped in normal  sterile fashion.  The patient received 2 grams of IV Ancef for surgical prophylaxis.  Timeout was performed.  The anterior vaginal epithelium was grasped at the apex with 2 Allis clamps.  A third Allis clamp was placed centrally and vaginal epithelium  was placed in the horizontal plane.  The central portion of the subvaginal epithelium was injected with 1% lidocaine with 1:100,000 epinephrine. Central portion of the vaginal epithelium was opened all the way to the 1.5 cm from the urethral meatus.  The  subvaginal epithelial tissues were dissected with sharp and blunt dissection.  Healthy endopelvic fascia was identified and the defect was repaired with horizontal 2-0 Vicryl suture.  Vaginal epithelium was then trimmed and the vaginal epithelium was  closed with 0 Vicryl suture in a running nonlocking fashion.  Attention was directed to the patient's hymenal ring posterior vaginal  epithelium.  Two Allis clamps were placed at the hymenal ring and a triangle shaped area was injected with lidocaine with  epinephrine and epithelium was removed for the later perineorrhaphy.  Posterior vaginal wall was opened centrally after injecting with 1% lidocaine with epinephrine.  The perirectal tissues were dissected from the vaginal epithelium.  Healthy endopelvic  fascia was identified and interrupted 2-0 mattress sutures were used to repair the defect and the vaginal epithelium was trimmed and closed with 0 Vicryl suture.  Crown sutures were placed at the perineal body and the skin was closed with 3-0 Vicryl in  subcuticular fashion and good repair.  Adequate vaginal accommodation at the end of the procedure noted.  The patient's bladder was catheterized before commencement of the case yielding 100 mL of urine, at the end of the case additional 50 mL was  removed.  Good hemostasis noted.  There were Shah complications.  ESTIMATED BLOOD LOSS:  10 mL  INTRAOPERATIVE FLUIDS:  800 mL  URINE OUTPUT:  150 mL  The patient did receive 30 mg Toradol at the end of the procedure.   VAI D: 12/03/2022 10:04:50 am T: 12/03/2022 10:32:00 am  JOB: [id number]/ [id number]

## 2022-12-03 NOTE — Transfer of Care (Signed)
Immediate Anesthesia Transfer of Care Note  Patient: Cheryl Shah  Procedure(s) Performed: ANTERIOR (CYSTOCELE) AND POSTERIOR REPAIR (RECTOCELE) (Vagina )  Patient Location: PACU  Anesthesia Type:General  Level of Consciousness: drowsy and patient cooperative  Airway & Oxygen Therapy: Patient Spontanous Breathing  Post-op Assessment: Report given to RN and Post -op Vital signs reviewed and stable  Post vital signs: Reviewed and stable  Last Vitals:  Vitals Value Taken Time  BP 118/75 12/03/22 1000  Temp 36.2 C 12/03/22 1000  Pulse 66 12/03/22 1004  Resp 14 12/03/22 1004  SpO2 97 % 12/03/22 1004  Vitals shown include unvalidated device data.  Last Pain:  Vitals:   12/03/22 0629  TempSrc: Temporal  PainSc: 0-No pain         Complications: There were no known notable events for this encounter.

## 2022-12-03 NOTE — Progress Notes (Signed)
Here for A+P repair . Labs reviewed . All questions answered . Proceed

## 2022-12-03 NOTE — Anesthesia Postprocedure Evaluation (Signed)
Anesthesia Post Note  Patient: Cheryl Shah  Procedure(s) Performed: ANTERIOR (CYSTOCELE) AND POSTERIOR REPAIR (RECTOCELE) (Vagina )  Patient location during evaluation: PACU Anesthesia Type: General Level of consciousness: awake and alert Pain management: pain level controlled Vital Signs Assessment: post-procedure vital signs reviewed and stable Respiratory status: spontaneous breathing, nonlabored ventilation, respiratory function stable and patient connected to nasal cannula oxygen Cardiovascular status: blood pressure returned to baseline and stable Postop Assessment: no apparent nausea or vomiting Anesthetic complications: no   There were no known notable events for this encounter.   Last Vitals:  Vitals:   12/03/22 1138 12/03/22 1209  BP: 105/74 113/71  Pulse: 60 60  Resp: 16 16  Temp: (!) 36.3 C   SpO2: 100% 99%    Last Pain:  Vitals:   12/03/22 1138  TempSrc: Temporal  PainSc: 4                  Corinda Gubler

## 2022-12-03 NOTE — Brief Op Note (Signed)
12/03/2022  9:41 AM  PATIENT:  Cheryl Shah  52 y.o. female  PRE-OPERATIVE DIAGNOSIS:  cystocele, rectocele  POST-OPERATIVE DIAGNOSIS:  cystocele, rectocele  PROCEDURE:  Procedure(s): ANTERIOR (CYSTOCELE) AND POSTERIOR REPAIR (RECTOCELE) (N/A) perineorrhaphy SURGEON:  Surgeon(s) and Role:    * Tray Klayman, Ihor Austin, MD - Primary    * Christeen Douglas, MD - Assisting  PHYSICIAN ASSISTANT: CST  ASSISTANTS: none   ANESTHESIA:   general  EBL:  10 mL IOF 800 cc ou 150 cc  BLOOD ADMINISTERED:none  DRAINS: none   LOCAL MEDICATIONS USED:  LIDOCAINE   SPECIMEN:  No Specimen  DISPOSITION OF SPECIMEN:  PATHOLOGY  COUNTS:  YES  TOURNIQUET:  * No tourniquets in log *  DICTATION: .Other Dictation: Dictation Number verbal   PLAN OF CARE: Discharge to home after PACU  PATIENT DISPOSITION:  PACU - hemodynamically stable.   Delay start of Pharmacological VTE agent (>24hrs) due to surgical blood loss or risk of bleeding: not applicable

## 2022-12-03 NOTE — Progress Notes (Signed)
Passed voiding trial 120 out and residual 25 cc on bladder scan   Precautions given   D/c home

## 2022-12-04 ENCOUNTER — Encounter: Payer: Self-pay | Admitting: Obstetrics and Gynecology

## 2023-01-10 DIAGNOSIS — Z9889 Other specified postprocedural states: Secondary | ICD-10-CM | POA: Diagnosis not present

## 2023-01-11 ENCOUNTER — Ambulatory Visit
Admission: RE | Admit: 2023-01-11 | Discharge: 2023-01-11 | Disposition: A | Discharge status: Home / Self Care | Payer: BC Managed Care – PPO | Source: Ambulatory Visit | Attending: Family Medicine | Admitting: Family Medicine

## 2023-01-11 DIAGNOSIS — Z1231 Encounter for screening mammogram for malignant neoplasm of breast: Secondary | ICD-10-CM | POA: Diagnosis not present

## 2023-01-21 DIAGNOSIS — Z1211 Encounter for screening for malignant neoplasm of colon: Secondary | ICD-10-CM | POA: Diagnosis not present

## 2023-01-21 DIAGNOSIS — K5730 Diverticulosis of large intestine without perforation or abscess without bleeding: Secondary | ICD-10-CM | POA: Diagnosis not present

## 2023-01-21 DIAGNOSIS — K640 First degree hemorrhoids: Secondary | ICD-10-CM | POA: Diagnosis not present

## 2023-10-04 ENCOUNTER — Encounter: Payer: Self-pay | Admitting: Family Medicine

## 2023-10-04 ENCOUNTER — Ambulatory Visit (INDEPENDENT_AMBULATORY_CARE_PROVIDER_SITE_OTHER)
Admission: RE | Admit: 2023-10-04 | Discharge: 2023-10-04 | Disposition: A | Discharge status: Home / Self Care | Source: Ambulatory Visit | Attending: Family Medicine | Admitting: Family Medicine

## 2023-10-04 ENCOUNTER — Ambulatory Visit: Admitting: Family Medicine

## 2023-10-04 VITALS — BP 132/80 | HR 83 | Temp 98.2°F | Ht 66.0 in | Wt 147.5 lb

## 2023-10-04 DIAGNOSIS — M7989 Other specified soft tissue disorders: Secondary | ICD-10-CM | POA: Diagnosis not present

## 2023-10-04 DIAGNOSIS — M25529 Pain in unspecified elbow: Secondary | ICD-10-CM | POA: Insufficient documentation

## 2023-10-04 DIAGNOSIS — M25521 Pain in right elbow: Secondary | ICD-10-CM

## 2023-10-04 MED ORDER — MELOXICAM 15 MG PO TABS: 15.0000 mg | ORAL_TABLET | Freq: Every day | ORAL | 1 refills | Status: AC | PRN

## 2023-10-04 NOTE — Progress Notes (Signed)
 Subjective:    Patient ID: Cheryl Shah, female    DOB: [birth year], 53 y.o.   MRN: [id number]  HPI  Wt Readings from Last 3 Encounters:  10/04/23 147 lb 8 oz (66.9 kg)  12/03/22 140 lb (63.5 kg)  11/25/22 138 lb 8 oz (62.8 kg)   23.81 kg/m  Vitals:   10/04/23 1455  BP: 132/80  Pulse: 83  Temp: 98.2 F (36.8 C)  SpO2: 100%    Pt presents with c/o right arm pain for about a week   Right arm  Pain - sharp in nature and radiates up  More lateral than medial  Worse than in the past  Some swelling   Worse to extend than flex  Worst to pronate/supinate   Hurts to throw toy for dog    Hard to lift things with that hand  Open a can or jar  Throbs when trying to sleep at night    Right handed    Has used elblow compression sleeve  Has not used ice or heat  Took some advil  Used a topical product - biofreeze      No trauma now but does have old injury  ? Overuse    8 years ago had strained something in the elbow -similar to this but not as bad  Never went to doctor or had eval/ just got better on its own   Imaging  DG Elbow 2 Views Right Result Date: 10/04/2023 CLINICAL DATA:  Elbow pain and swelling EXAM: RIGHT ELBOW - 2 VIEW COMPARISON:  None Available. FINDINGS: There is no evidence of fracture, dislocation, or joint effusion. There is no evidence of arthropathy or other focal bone abnormality. Soft tissues are unremarkable. IMPRESSION: Negative. Electronically Signed   By: Fredrich Jefferson M.D.   On: 10/04/2023 15:37     Patient Active Problem List   Diagnosis Date Noted   Elbow pain 10/04/2023   Cystocele with prolapse 11/26/2022   Chronic nausea 11/25/2022   H/O: hypertension 11/25/2022   RLS (restless legs syndrome) 11/25/2022   Encounter for screening mammogram for breast cancer 11/25/2022   OCD (obsessive compulsive disorder) 11/25/2022   Past Medical History:  Diagnosis Date   Chronic nausea    Cystocele with rectocele    Encounter for  screening mammogram for breast cancer 11/25/2022   History of eating disorder    Hypertension    h/o lost 38 lbs and was able to come off Amlodipine  due to bp control   OCD (obsessive compulsive disorder)    RLS (restless legs syndrome)    Past Surgical History:  Procedure Laterality Date   ABDOMINAL HYSTERECTOMY     ANTERIOR AND POSTERIOR REPAIR N/A 12/03/2022   Procedure: ANTERIOR (CYSTOCELE) AND POSTERIOR REPAIR (RECTOCELE);  Surgeon: Schermerhorn, Joselyn Nicely, MD;  Location: [facility] ORS;  Service: Gynecology;  Laterality: N/A;   TUBAL LIGATION     Social History   Tobacco Use   Smoking status: Former    Current packs/day: 0.00    Average packs/day: 0.3 packs/day for 20.0 years (5.0 ttl pk-yrs)    Types: Cigarettes    Start date: 01/06/1999    Quit date: 01/06/2019    Years since quitting: 4.7   Smokeless tobacco: Never  Vaping Use   Vaping status: Never Used  Substance Use Topics   Alcohol use: Yes    Alcohol/week: 2.0 standard drinks of alcohol    Types: 2 Glasses of wine per week    Comment: occ  Drug use: Never   Family History  Problem Relation Age of Onset   Hypertension Mother    Cancer Mother    Heart Problems Mother    Heart Problems Father    Diabetes Sister    Drug abuse Daughter        resolved/recovered   Allergies  Allergen Reactions   Sulfa  Antibiotics Anaphylaxis   Bactrim  [Sulfamethoxazole -Trimethoprim ] Hives and Rash   No current outpatient medications on file prior to visit.   No current facility-administered medications on file prior to visit.    Review of Systems  Constitutional:  Negative for activity change, appetite change, fatigue, fever and unexpected weight change.  HENT:  Negative for congestion, ear pain, rhinorrhea, sinus pressure and sore throat.   Eyes:  Negative for pain, redness and visual disturbance.  Respiratory:  Negative for cough, shortness of breath and wheezing.   Cardiovascular:  Negative for chest pain and palpitations.   Gastrointestinal:  Negative for abdominal pain, blood in stool, constipation and diarrhea.  Endocrine: Negative for polydipsia and polyuria.  Genitourinary:  Negative for dysuria, frequency and urgency.  Musculoskeletal:  Positive for arthralgias. Negative for back pain and myalgias.       Right elbow pain   Skin:  Negative for pallor and rash.  Allergic/Immunologic: Negative for environmental allergies.  Neurological:  Negative for dizziness, syncope and headaches.  Hematological:  Negative for adenopathy. Does not bruise/bleed easily.  Psychiatric/Behavioral:  Negative for decreased concentration and dysphoric mood. The patient is not nervous/anxious.        Objective:   Physical Exam Constitutional:      General: She is not in acute distress.    Appearance: She is well-developed.  HENT:     Head: Normocephalic and atraumatic.  Eyes:     Conjunctiva/sclera: Conjunctivae normal.     Pupils: Pupils are equal, round, and reactive to light.  Neck:     Thyroid: No thyromegaly.     Vascular: No carotid bruit or JVD.  Cardiovascular:     Rate and Rhythm: Normal rate and regular rhythm.     Heart sounds: Normal heart sounds.     No gallop.  Pulmonary:     Effort: Pulmonary effort is normal. No respiratory distress.     Breath sounds: Normal breath sounds. No wheezing or rales.  Abdominal:     General: There is no distension or abdominal bruit.     Palpations: Abdomen is soft.  Musculoskeletal:     Right elbow: Swelling present. No deformity, effusion or lacerations. Decreased range of motion. Tenderness present in lateral epicondyle.     Cervical back: Normal range of motion and neck supple.     Right lower leg: No edema.     Left lower leg: No edema.     Comments: Pain with resisted flex and ext of right elbow  Mild swelling /no ecchymosis   Lymphadenopathy:     Cervical: No cervical adenopathy.  Skin:    General: Skin is warm and dry.     Coloration: Skin is not pale.      Findings: No rash.  Neurological:     Mental Status: She is alert.     Sensory: No sensory deficit.     Motor: No weakness.     Coordination: Coordination normal.     Deep Tendon Reflexes: Reflexes are normal and symmetric. Reflexes normal.  Psychiatric:        Mood and Affect: Mood normal.  Assessment & Plan:   Problem List Items Addressed This Visit       Other   Elbow pain - Primary   Right lateral elbow pain (has happened once before) with epicondyle tenderness and pain with movement  Suspect lateral epicondylitis  Xray-no acute changes today Recommend continue compression sleeve prn  Meloxicam 15 mg daily prn with food (watch for gi side effects) Ice - as often as possible Handout on condition and rehab  May plan sport med visit       Relevant Orders   DG Elbow 2 Views Right (Completed)

## 2023-10-04 NOTE — Assessment & Plan Note (Signed)
 Right lateral elbow pain (has happened once before) with epicondyle tenderness and pain with movement  Suspect lateral epicondylitis  Xray-no acute changes today Recommend continue compression sleeve prn  Meloxicam 15 mg daily prn with food (watch for gi side effects) Ice - as often as possible Handout on condition and rehab  May plan sport med visit

## 2023-10-04 NOTE — Patient Instructions (Addendum)
 Try meloxicam 15 mg daily as needed with meal instead of ibuprofen  If it upsets your stomach -then stop it   Use ice every chance you can   Avoid activities that hurt a lot   Xray today   Take a look at the handouts

## 2023-10-06 NOTE — Telephone Encounter (Signed)
 Copied from CRM [id number]. Topic: Clinical - Lab/Test Results >> Oct 05, 2023  5:00 PM Dewanda Foots wrote: Reason for CRM: Pt is returning phone call to go over Xray results. Called over to the office but it was already closed. Recommended pt to call back tomorrow.

## 2023-12-19 ENCOUNTER — Ambulatory Visit: Payer: Self-pay

## 2023-12-19 NOTE — Telephone Encounter (Signed)
 FYI Only or Action Required?: FYI only for provider.  Patient was last seen in primary care on 10/04/2023 by Cheryl Shah LABOR, MD.  Called Nurse Triage reporting Joint Swelling and pain.  Symptoms began several months ago.  Interventions attempted: OTC medications: Seen at PCP.  Symptoms are: gradually worsening.  Triage Disposition: See PCP When Office is Open (Within 3 Days)  Patient/caregiver understands and will follow disposition?: Yes                   Copied from CRM [id number]. Topic: Clinical - Red Word Triage >> Dec 19, 2023  8:18 AM Cheryl Shah wrote: Red Word that prompted transfer to Nurse Triage: Patient has tennis elbow in right arm, has had it for months. Swelling at night, rates pain a 10 on scale of 1-10. Dr. Randeen said she may need appointment with Dr. Watt (sports med) but patient say she is just tired of always being in pain. Reason for Disposition  MILD OR MODERATE joint swelling (e.g., feels or looks mildly swollen or puffy)  Answer Assessment - Initial Assessment Questions 1. LOCATION: Where is the swelling? (e.g., left, right, both elbows)     Right arm - starts swelling at about 6:30 2. SIZE and DESCRIPTION: What does the swelling look like? (e.g., entire elbow, localized)     Swollen 3. ONSET: When did the swelling start? Does it come and go, or is it there all the time?     Starts each evening at 6 in the evening 4. WORK OR EXERCISE: Has there been any recent work, exercise or other activity that involved that part of the body?      Using that arm 5. AGGRAVATING FACTORS: What makes the elbow swelling worse? (e.g., work, sports activities)     movement 6. ASSOCIATED SYMPTOMS: Is there any pain or redness?     no 7. OTHER SYMPTOMS: Do you have any other symptoms? (e.g., fever)     Stiffness in elbow  Protocols used: Elbow Swelling-A-AH

## 2023-12-19 NOTE — Telephone Encounter (Signed)
 Will see patient then Agree with ER and UC precautions

## 2023-12-21 ENCOUNTER — Ambulatory Visit: Admitting: Family Medicine

## 2023-12-21 ENCOUNTER — Encounter: Payer: Self-pay | Admitting: Family Medicine

## 2023-12-21 VITALS — BP 128/86 | HR 77 | Temp 98.6°F | Ht 66.0 in | Wt 142.0 lb

## 2023-12-21 DIAGNOSIS — F429 Obsessive-compulsive disorder, unspecified: Secondary | ICD-10-CM | POA: Diagnosis not present

## 2023-12-21 DIAGNOSIS — M25521 Pain in right elbow: Secondary | ICD-10-CM

## 2023-12-21 MED ORDER — PREDNISONE 10 MG PO TABS: ORAL_TABLET | ORAL | 0 refills | Status: DC

## 2023-12-21 NOTE — Patient Instructions (Addendum)
 Continue using ice on elbow  Try not to over tax this arm   Take prednisone  as directed  It may make you feel hyper, hungry , irritable    Stop at check out to make an appointment with Dr Watt

## 2023-12-21 NOTE — Assessment & Plan Note (Signed)
 Exam/symptoms consistent with lateral epicondylitis  Initially improved with meloxicam / now worse again  Some mild swelling  Using ice and doing rehab maneuvers  Will ref to sport med for further eval /treatment  Prednisone  40 mg taper prescription for inflammation (reviewed possible side effects)   Call back and Er precautions noted in detail today

## 2023-12-21 NOTE — Progress Notes (Signed)
 Subjective:    Patient ID: Cheryl Shah, female    DOB: 1971-03-04, 53 y.o.   MRN: [id number]  HPI  Wt Readings from Last 3 Encounters:  12/21/23 142 lb (64.4 kg)  10/04/23 147 lb 8 oz (66.9 kg)  12/03/22 140 lb (63.5 kg)   22.92 kg/m  Vitals:   12/21/23 1447  BP: 128/86  Pulse: 77  Temp: 98.6 F (37 C)  SpO2: 100%    Pt presnets for Right arm/elbow pain  We saw her for this also in early may   At that time imaging  Imaging Results[] Expand by Default  DG Elbow 2 Views Right Result Date: 10/04/2023 CLINICAL DATA:  Elbow pain and swelling EXAM: RIGHT ELBOW - 2 VIEW COMPARISON:  None Available. FINDINGS: There is no evidence of fracture, dislocation, or joint effusion. There is no evidence of arthropathy or other focal bone abnormality. Soft tissues are unremarkable. IMPRESSION: Negative. Electronically Signed   By: Franky Chard M.D.     Dx with likely lateral epicondylitis  Recommended compression prn , meloxicam , ice  Handout given on rehab   Seemed to help the first 2 weeks  Then it stopped working  Pain would come and go  Med never worked for full 24 hours   Used a brace for tendonitis/tennis elbow  Tried it for 4 weeks  Did not help at all    Now both stiff and swollen for past 4 weeks   Feels better straight then bent  Hard to get comfortable at night in bed   Takes a while to get moving in the am  Is right handed   Worse again by 6 pm  At work uses keyboard and mouse  Changed positions / tried all ergonomic changes she could think of   Tried rehab exercises and ice    Patient Active Problem List   Diagnosis Date Noted   Elbow pain 10/04/2023   Cystocele with prolapse 11/26/2022   Chronic nausea 11/25/2022   H/O: hypertension 11/25/2022   RLS (restless legs syndrome) 11/25/2022   Encounter for screening mammogram for breast cancer 11/25/2022   OCD (obsessive compulsive disorder) 11/25/2022   Past Medical History:  Diagnosis Date    Chronic nausea    Cystocele with rectocele    Encounter for screening mammogram for breast cancer 11/25/2022   History of eating disorder    Hypertension    h/o lost 38 lbs and was able to come off Amlodipine  due to bp control   OCD (obsessive compulsive disorder)    RLS (restless legs syndrome)    Past Surgical History:  Procedure Laterality Date   ABDOMINAL HYSTERECTOMY     ANTERIOR AND POSTERIOR REPAIR N/A 12/03/2022   Procedure: ANTERIOR (CYSTOCELE) AND POSTERIOR REPAIR (RECTOCELE);  Surgeon: Schermerhorn, Debby PARAS, MD;  Location: [facility] ORS;  Service: Gynecology;  Laterality: N/A;   TUBAL LIGATION     Social History   Tobacco Use   Smoking status: Former    Current packs/day: 0.00    Average packs/day: 0.3 packs/day for 20.0 years (5.0 ttl pk-yrs)    Types: Cigarettes    Start date: 01/06/1999    Quit date: 01/06/2019    Years since quitting: 4.9   Smokeless tobacco: Never  Vaping Use   Vaping status: Never Used  Substance Use Topics   Alcohol use: Yes    Alcohol/week: 2.0 standard drinks of alcohol    Types: 2 Glasses of wine per week    Comment: occ  Drug use: Never   Family History  Problem Relation Age of Onset   Hypertension Mother    Cancer Mother    Heart Problems Mother    Heart Problems Father    Diabetes Sister    Drug abuse Daughter        resolved/recovered   Allergies  Allergen Reactions   Sulfa  Antibiotics Anaphylaxis   Bactrim  [Sulfamethoxazole -Trimethoprim ] Hives and Rash   Current Outpatient Medications on File Prior to Visit  Medication Sig Dispense Refill   meloxicam  (MOBIC ) 15 MG tablet Take 1 tablet (15 mg total) by mouth daily as needed for pain (elbow pain). With meal 30 tablet 1   No current facility-administered medications on file prior to visit.    Review of Systems     Objective:   Physical Exam Constitutional:      General: She is not in acute distress.    Appearance: Normal appearance. She is normal weight. She is not  ill-appearing.  Cardiovascular:     Rate and Rhythm: Normal rate and regular rhythm.     Comments: Normal radial and ulnar pulses  Musculoskeletal:     Right elbow: Swelling present. No deformity, effusion or lacerations. Decreased range of motion. Tenderness present in lateral epicondyle. No radial head, medial epicondyle or olecranon process tenderness.     Comments: Tender over right lateral epicondyle Worse to flex wrist with extended elbow  Some mild soft tissue swelling of lateral elbow area   Neurological:     Mental Status: She is alert.     Sensory: No sensory deficit.     Motor: No weakness.           Assessment & Plan:   Problem List Items Addressed This Visit       Other   OCD (obsessive compulsive disorder)   Aware pt has had this for years  Currently urge to clean is further hurting her elbow and she is pushing through   Plan to discuss treatment options in future if she would be open to that (ssri , counseling)         Elbow pain - Primary   Exam/symptoms consistent with lateral epicondylitis  Initially improved with meloxicam / now worse again  Some mild swelling  Using ice and doing rehab maneuvers  Will ref to sport med for further eval /treatment  Prednisone  40 mg taper prescription for inflammation (reviewed possible side effects)   Call back and Er precautions noted in detail today

## 2023-12-21 NOTE — Assessment & Plan Note (Signed)
 Aware pt has had this for years  Currently urge to clean is further hurting her elbow and she is pushing through   Plan to discuss treatment options in future if she would be open to that (ssri , counseling)

## 2024-01-16 ENCOUNTER — Ambulatory Visit: Admitting: Family Medicine

## 2024-05-04 ENCOUNTER — Ambulatory Visit: Admitting: Family Medicine

## 2024-05-04 ENCOUNTER — Ambulatory Visit: Payer: Self-pay

## 2024-05-04 ENCOUNTER — Encounter: Payer: Self-pay | Admitting: Family Medicine

## 2024-05-04 VITALS — BP 136/98 | HR 93 | Temp 98.7°F | Resp 16 | Wt 153.9 lb

## 2024-05-04 DIAGNOSIS — J069 Acute upper respiratory infection, unspecified: Secondary | ICD-10-CM | POA: Diagnosis not present

## 2024-05-04 DIAGNOSIS — H6693 Otitis media, unspecified, bilateral: Secondary | ICD-10-CM

## 2024-05-04 DIAGNOSIS — J029 Acute pharyngitis, unspecified: Secondary | ICD-10-CM | POA: Diagnosis not present

## 2024-05-04 DIAGNOSIS — R6889 Other general symptoms and signs: Secondary | ICD-10-CM

## 2024-05-04 LAB — POCT RAPID STREP A (OFFICE): Rapid Strep A Screen: NEGATIVE

## 2024-05-04 LAB — POCT INFLUENZA A/B
Influenza A, POC: NEGATIVE
Influenza B, POC: NEGATIVE

## 2024-05-04 LAB — POC COVID19 BINAXNOW: SARS Coronavirus 2 Ag: NEGATIVE

## 2024-05-04 MED ORDER — AMOXICILLIN 500 MG PO CAPS: 1000.0000 mg | ORAL_CAPSULE | Freq: Two times a day (BID) | ORAL | 0 refills | Status: AC

## 2024-05-04 NOTE — Progress Notes (Signed)
 Established patient visit   Patient: Cheryl Shah   DOB: Nov 14, 1970   53 y.o. Female  MRN: [id number] Visit Date: 05/04/2024  Today's healthcare provider: Nancyann Perry, MD   Chief Complaint  Patient presents with   URI    Symptoms: headache,congestion,sore throat, achy body ear pain x 1 day. Pain is 10/10 in her head. States a little off balance with the headache. Denies any weakness, numbness on one side of face or body. Denies fever, vision changes and higher acuity  Started yesterday. OTC medications: mucinex, robitussin, ibuprofen, Nyquil.   Subjective    Discussed the use of AI scribe software for clinical note transcription with the patient, who gave verbal consent to proceed.  History of Present Illness   Cheryl Shah is a 53 year old female patient of Dr. Randeen at [facility] who presents with upper respiratory symptoms including sore throat, cough, and body aches.  Her symptoms began on Tuesday night with a tingling sensation in her throat, which worsened by Wednesday morning, prompting her to leave work early due to feeling unwell. She experiences a sore throat, cough, runny nose, severe headache, ear pain, and body aches. No difficulty breathing or shortness of breath.  She works in a garment/textile technologist facility and notes that several employees have had similar symptoms, although the residents have not been affected. Her mother has also been experiencing similar symptoms for about three weeks.  She has been self-medicating with over-the-counter cold medications such as Nyquil and Mucinex. She has not received a flu shot this year. She denies any history of lung problems such as asthma or emphysema. She is allergic to sulfa  drugs but tolerates amoxicillin  well.       Medications: Outpatient Medications Prior to Visit  Medication Sig   meloxicam  (MOBIC ) 15 MG tablet Take 1 tablet (15 mg total) by mouth daily as needed for pain (elbow pain). With meal  (Patient not taking: Reported on 05/04/2024)   predniSONE  (DELTASONE ) 10 MG tablet Take 4 pills once daily by mouth for 3 days, then 3 pills daily for 3 days, then 2 pills daily for 3 days then 1 pill daily for 3 days then stop (Patient not taking: Reported on 05/04/2024)   No facility-administered medications prior to visit.   Review of Systems     Objective    BP (!) 136/98 (BP Location: Left Arm, Patient Position: Sitting, Cuff Size: Normal)   Pulse 93   Temp 98.7 F (37.1 C) (Oral)   Resp 16   Wt 153 lb 14.4 oz (69.8 kg)   SpO2 99%   BMI 24.84 kg/m   Physical Exam   General Appearance:    Well developed, well nourished female, alert, cooperative, in no acute distress  HENT:   bilateral TM red, dull, bulging, neck without nodes, pharynx erythematous without exudate, sinuses nontender, and nasal mucosa pale and congested  Eyes:    PERRL, conjunctiva/corneas clear, EOM's intact       Lungs:     Clear to auscultation bilaterally, respirations unlabored  Heart:    Normal heart rate. Normal rhythm. No murmurs, rubs, or gallops.    Neurologic:   Awake, alert, oriented x 3. No apparent focal neurological           defect.         Results for orders placed or performed in visit on 05/04/24  POCT rapid strep A  Result Value Ref Range   Rapid  Strep A Screen Negative Negative  POCT Influenza A/B  Result Value Ref Range   Influenza A, POC Negative Negative   Influenza B, POC Negative Negative  POC COVID-19 BinaxNow  Result Value Ref Range   SARS Coronavirus 2 Ag Negative Negative     Assessment & Plan        Acute viral upper respiratory infection with acute bilateral otitis media and acute pharyngitis Acute viral infection with bilateral otitis media and pharyngitis. Likely viral etiology, possibly RSV. Negative flu and COVID tests. Considered antibiotics due to ear pain and risk of sinus infection. - Prescribed amoxicillin  for ear pain and to prevent sinus infection. -  Continue Nyquil and Mucinex. - Use acetaminophen  or ibuprofen for myalgia. - Monitor for fever 102F or higher, manage with antipyretics.  General symptoms (headache, body aches, ear pain) associated with acute respiratory infection Symptoms include headache, myalgia, and ear pain, consistent with acute respiratory infection. Expected resolution in 4-5 days. - Continue over-the-counter medications. - Symptoms should improve in 3-4 days. Call otherwise        Nancyann Perry, MD  [facility] [phone] (phone) [phone] (fax)  [facility]

## 2024-05-04 NOTE — Telephone Encounter (Signed)
 FYI Only or Action Required?: FYI only for provider: appointment scheduled on 12.5.25.  Patient was last seen in primary care on 12/21/2023 by Randeen Laine LABOR, MD.  Called Nurse Triage reporting Headache and Nasal Congestion.  Symptoms began yesterday.  Interventions attempted: OTC medications: mucinex, robitussin, ibuprofen.  Symptoms are: rapidly worsening.  Triage Disposition: See HCP Within 4 Hours (Or PCP Triage)  Patient/caregiver understands and will follow disposition?: Yes  Copied from CRM [id number]. Topic: Clinical - Red Word Triage >> May 04, 2024  8:45 AM Robinson H wrote: [facility] that prompted transfer to Nurse Triage: Cold symptoms, headache 10, body aches Reason for Disposition  [1] SEVERE headache (e.g., excruciating) AND [2] not improved after 2 hours of pain medicine  Answer Assessment - Initial Assessment Questions Headache, sore throat, congestion, achy body ear pain x 1 day. States she's tried mucinex, robitussin and is getting worse rapidly. Pain is 10/10 in her head. States a little off balance with the headache. Denies any weakness, numbness on one side of face or body. Denies fever, vision changes and higher acuity questions.    1. LOCATION: Where does it hurt?      generalized 2. ONSET: When did the headache start? (e.g., minutes, hours, days)      yesterday 3. PATTERN: Does the pain come and go, or has it been constant since it started?     constant 4. SEVERITY: How bad is the pain? and What does it keep you from doing?  (e.g., Scale 1-10; mild, moderate, or severe)     10 5. CAUSE: What do you think is causing the headache?     Sick 6 HEAD INJURY: Has there been any recent injury to your head?      no 7. OTHER SYMPTOMS: Do you have any other symptoms? (e.g., fever, stiff neck, eye pain, sore throat, cold symptoms)     Congestion, body aches, sort throat, ear pain  Protocols used: Headache-A-AH
